# Patient Record
Sex: Male | Born: 1955 | Race: Black or African American | Hispanic: No | Marital: Single | Smoking: Light tobacco smoker
Health system: Southern US, Community
[De-identification: ages and names within clinical notes are randomized; demographics above are authoritative.]

## PROBLEM LIST (undated history)

## (undated) DIAGNOSIS — R519 Headache, unspecified: Secondary | ICD-10-CM

## (undated) DIAGNOSIS — I10 Essential (primary) hypertension: Principal | ICD-10-CM

## (undated) DIAGNOSIS — R51 Headache: Secondary | ICD-10-CM

## (undated) DIAGNOSIS — S82873A Displaced pilon fracture of unspecified tibia, initial encounter for closed fracture: Secondary | ICD-10-CM

## (undated) HISTORY — PX: WISDOM TOOTH EXTRACTION: SHX21

## (undated) HISTORY — DX: Essential (primary) hypertension: I10

## (undated) HISTORY — PX: EYE SURGERY: SHX253

---

## 1999-08-09 ENCOUNTER — Encounter: Payer: Self-pay | Admitting: Emergency Medicine

## 1999-08-09 ENCOUNTER — Emergency Department (HOSPITAL_COMMUNITY): Admission: EM | Admit: 1999-08-09 | Discharge: 1999-08-09 | Payer: Self-pay | Admitting: Emergency Medicine

## 2002-07-13 ENCOUNTER — Ambulatory Visit (HOSPITAL_COMMUNITY): Admission: RE | Admit: 2002-07-13 | Discharge: 2002-07-13 | Payer: Self-pay | Admitting: Family Medicine

## 2005-01-01 ENCOUNTER — Emergency Department (HOSPITAL_COMMUNITY): Admission: EM | Admit: 2005-01-01 | Discharge: 2005-01-01 | Payer: Self-pay | Admitting: Emergency Medicine

## 2015-01-31 ENCOUNTER — Emergency Department (HOSPITAL_COMMUNITY): Payer: Worker's Compensation | Admitting: Certified Registered Nurse Anesthetist

## 2015-01-31 ENCOUNTER — Encounter (HOSPITAL_COMMUNITY): Payer: Self-pay | Admitting: *Deleted

## 2015-01-31 ENCOUNTER — Emergency Department (HOSPITAL_COMMUNITY): Payer: Worker's Compensation

## 2015-01-31 ENCOUNTER — Encounter (HOSPITAL_COMMUNITY)
Admission: EM | Disposition: A | Discharge status: Home / Self Care | Payer: Self-pay | Source: Home / Self Care | Attending: Orthopedic Surgery

## 2015-01-31 ENCOUNTER — Inpatient Hospital Stay (HOSPITAL_COMMUNITY)
Admission: EM | Admit: 2015-01-31 | Discharge: 2015-02-02 | DRG: 492 | Disposition: A | Discharge status: Home / Self Care | Payer: Worker's Compensation | Attending: Orthopedic Surgery | Admitting: Orthopedic Surgery

## 2015-01-31 DIAGNOSIS — Z23 Encounter for immunization: Secondary | ICD-10-CM | POA: Diagnosis not present

## 2015-01-31 DIAGNOSIS — S9702XA Crushing injury of left ankle, initial encounter: Secondary | ICD-10-CM | POA: Diagnosis present

## 2015-01-31 DIAGNOSIS — S82442B Displaced spiral fracture of shaft of left fibula, initial encounter for open fracture type I or II: Secondary | ICD-10-CM | POA: Diagnosis present

## 2015-01-31 DIAGNOSIS — W240XXA Contact with lifting devices, not elsewhere classified, initial encounter: Secondary | ICD-10-CM | POA: Diagnosis present

## 2015-01-31 DIAGNOSIS — S82872B Displaced pilon fracture of left tibia, initial encounter for open fracture type I or II: Secondary | ICD-10-CM | POA: Diagnosis present

## 2015-01-31 DIAGNOSIS — F17210 Nicotine dependence, cigarettes, uncomplicated: Secondary | ICD-10-CM | POA: Diagnosis present

## 2015-01-31 DIAGNOSIS — S82873B Displaced pilon fracture of unspecified tibia, initial encounter for open fracture type I or II: Secondary | ICD-10-CM | POA: Diagnosis present

## 2015-01-31 HISTORY — PX: EXTERNAL FIXATION LEG: SHX1549

## 2015-01-31 SURGERY — EXTERNAL FIXATION, LOWER EXTREMITY
Site: Ankle | Laterality: Left

## 2015-01-31 MED ORDER — SUCCINYLCHOLINE CHLORIDE 20 MG/ML IJ SOLN
INTRAMUSCULAR | Status: DC | PRN
  Administered 2015-01-31: 100 mg via INTRAVENOUS

## 2015-01-31 MED ORDER — HYDROMORPHONE HCL 1 MG/ML IJ SOLN
1.0000 mg | INTRAMUSCULAR | Status: DC | PRN
  Administered 2015-02-01: 1 mg via INTRAVENOUS
  Filled 2015-01-31 (×2): qty 1

## 2015-01-31 MED ORDER — LIDOCAINE HCL (CARDIAC) 20 MG/ML IV SOLN
INTRAVENOUS | Status: AC
  Filled 2015-01-31: qty 15

## 2015-01-31 MED ORDER — CEFAZOLIN SODIUM 1-5 GM-% IV SOLN
1.0000 g | Freq: Four times a day (QID) | INTRAVENOUS | Status: DC
  Administered 2015-01-31 – 2015-02-02 (×6): 1 g via INTRAVENOUS
  Filled 2015-01-31 (×10): qty 50

## 2015-01-31 MED ORDER — DOCUSATE SODIUM 100 MG PO CAPS
100.0000 mg | ORAL_CAPSULE | Freq: Two times a day (BID) | ORAL | Status: DC
Start: 2015-01-31 — End: 2015-02-02
  Administered 2015-01-31 – 2015-02-02 (×4): 100 mg via ORAL
  Filled 2015-01-31 (×4): qty 1

## 2015-01-31 MED ORDER — NEOSTIGMINE METHYLSULFATE 10 MG/10ML IV SOLN
INTRAVENOUS | Status: AC
  Filled 2015-01-31: qty 1

## 2015-01-31 MED ORDER — ONDANSETRON HCL 4 MG PO TABS: 4.0000 mg | ORAL_TABLET | Freq: Four times a day (QID) | ORAL | Status: DC | PRN

## 2015-01-31 MED ORDER — OXYCODONE HCL 5 MG PO TABS
5.0000 mg | ORAL_TABLET | ORAL | Status: DC | PRN
  Administered 2015-01-31 – 2015-02-02 (×6): 10 mg via ORAL
  Filled 2015-01-31 (×6): qty 2

## 2015-01-31 MED ORDER — ONDANSETRON HCL 4 MG/2ML IJ SOLN: 4.0000 mg | Freq: Four times a day (QID) | INTRAMUSCULAR | Status: DC | PRN

## 2015-01-31 MED ORDER — SODIUM CHLORIDE 0.9 % IV SOLN: INTRAVENOUS | Status: DC

## 2015-01-31 MED ORDER — PHENYLEPHRINE HCL 10 MG/ML IJ SOLN
INTRAMUSCULAR | Status: DC | PRN
  Administered 2015-01-31 (×2): 80 ug via INTRAVENOUS

## 2015-01-31 MED ORDER — CEFAZOLIN SODIUM 1-5 GM-% IV SOLN
1.0000 g | Freq: Once | INTRAVENOUS | Status: AC
  Administered 2015-01-31: 1 g via INTRAVENOUS
  Filled 2015-01-31: qty 50

## 2015-01-31 MED ORDER — PHENYLEPHRINE 40 MCG/ML (10ML) SYRINGE FOR IV PUSH (FOR BLOOD PRESSURE SUPPORT)
PREFILLED_SYRINGE | INTRAVENOUS | Status: AC
  Filled 2015-01-31: qty 10

## 2015-01-31 MED ORDER — PROPOFOL 10 MG/ML IV BOLUS
INTRAVENOUS | Status: AC
  Filled 2015-01-31: qty 20

## 2015-01-31 MED ORDER — FENTANYL CITRATE (PF) 250 MCG/5ML IJ SOLN
INTRAMUSCULAR | Status: AC
  Filled 2015-01-31: qty 5

## 2015-01-31 MED ORDER — METOCLOPRAMIDE HCL 5 MG PO TABS: 5.0000 mg | ORAL_TABLET | Freq: Three times a day (TID) | ORAL | Status: DC | PRN

## 2015-01-31 MED ORDER — LIDOCAINE HCL (CARDIAC) 20 MG/ML IV SOLN
INTRAVENOUS | Status: DC | PRN
  Administered 2015-01-31: 100 mg via INTRAVENOUS

## 2015-01-31 MED ORDER — SODIUM CHLORIDE 0.9 % IJ SOLN
INTRAMUSCULAR | Status: AC
  Filled 2015-01-31: qty 10

## 2015-01-31 MED ORDER — MIDAZOLAM HCL 5 MG/5ML IJ SOLN
INTRAMUSCULAR | Status: DC | PRN
  Administered 2015-01-31: 2 mg via INTRAVENOUS

## 2015-01-31 MED ORDER — FENTANYL CITRATE (PF) 100 MCG/2ML IJ SOLN
INTRAMUSCULAR | Status: DC | PRN
  Administered 2015-01-31 (×3): 50 ug via INTRAVENOUS

## 2015-01-31 MED ORDER — FENTANYL CITRATE (PF) 100 MCG/2ML IJ SOLN
50.0000 ug | INTRAMUSCULAR | Status: DC | PRN
  Administered 2015-01-31: 50 ug via INTRAVENOUS
  Filled 2015-01-31: qty 2

## 2015-01-31 MED ORDER — MIDAZOLAM HCL 2 MG/2ML IJ SOLN: 0.5000 mg | Freq: Once | INTRAMUSCULAR | Status: DC | PRN

## 2015-01-31 MED ORDER — MIDAZOLAM HCL 2 MG/2ML IJ SOLN
INTRAMUSCULAR | Status: AC
  Filled 2015-01-31: qty 2

## 2015-01-31 MED ORDER — CEFAZOLIN SODIUM-DEXTROSE 2-3 GM-% IV SOLR
INTRAVENOUS | Status: DC | PRN
  Administered 2015-01-31: 2 g via INTRAVENOUS

## 2015-01-31 MED ORDER — KETOROLAC TROMETHAMINE 30 MG/ML IJ SOLN
30.0000 mg | Freq: Once | INTRAMUSCULAR | Status: AC
  Administered 2015-01-31: 30 mg via INTRAVENOUS

## 2015-01-31 MED ORDER — HYDROMORPHONE HCL 1 MG/ML IJ SOLN
INTRAMUSCULAR | Status: AC
  Administered 2015-01-31: 0.5 mg via INTRAVENOUS
  Filled 2015-01-31: qty 2

## 2015-01-31 MED ORDER — METOCLOPRAMIDE HCL 5 MG/ML IJ SOLN: 5.0000 mg | Freq: Three times a day (TID) | INTRAMUSCULAR | Status: DC | PRN

## 2015-01-31 MED ORDER — ACETAMINOPHEN 650 MG RE SUPP: 650.0000 mg | Freq: Four times a day (QID) | RECTAL | Status: DC | PRN

## 2015-01-31 MED ORDER — TETANUS-DIPHTH-ACELL PERTUSSIS 5-2.5-18.5 LF-MCG/0.5 IM SUSP
0.5000 mL | Freq: Once | INTRAMUSCULAR | Status: AC
  Administered 2015-01-31: 0.5 mL via INTRAMUSCULAR
  Filled 2015-01-31: qty 0.5

## 2015-01-31 MED ORDER — ONDANSETRON HCL 4 MG/2ML IJ SOLN
INTRAMUSCULAR | Status: AC
  Filled 2015-01-31: qty 2

## 2015-01-31 MED ORDER — GENTAMICIN SULFATE 40 MG/ML IJ SOLN
1.5000 mg/kg | INTRAVENOUS | Status: AC
  Administered 2015-01-31: 130 mg via INTRAVENOUS
  Filled 2015-01-31: qty 3.25

## 2015-01-31 MED ORDER — DEXTROSE 5 % IV SOLN
500.0000 mg | Freq: Four times a day (QID) | INTRAVENOUS | Status: DC | PRN
  Administered 2015-01-31: 500 mg via INTRAVENOUS
  Filled 2015-01-31 (×2): qty 5

## 2015-01-31 MED ORDER — ACETAMINOPHEN 325 MG PO TABS
650.0000 mg | ORAL_TABLET | Freq: Four times a day (QID) | ORAL | Status: DC | PRN
  Administered 2015-02-01 – 2015-02-02 (×3): 650 mg via ORAL
  Filled 2015-01-31 (×3): qty 2

## 2015-01-31 MED ORDER — 0.9 % SODIUM CHLORIDE (POUR BTL) OPTIME
TOPICAL | Status: DC | PRN
  Administered 2015-01-31: 1000 mL

## 2015-01-31 MED ORDER — ASPIRIN EC 325 MG PO TBEC
325.0000 mg | DELAYED_RELEASE_TABLET | Freq: Every day | ORAL | Status: DC
  Administered 2015-01-31 – 2015-02-02 (×3): 325 mg via ORAL
  Filled 2015-01-31 (×3): qty 1

## 2015-01-31 MED ORDER — ONDANSETRON HCL 4 MG/2ML IJ SOLN
INTRAMUSCULAR | Status: DC | PRN
Start: 2015-01-31 — End: 2015-01-31
  Administered 2015-01-31: 4 mg via INTRAVENOUS

## 2015-01-31 MED ORDER — MEPERIDINE HCL 25 MG/ML IJ SOLN
6.2500 mg | INTRAMUSCULAR | Status: DC | PRN
Start: 2015-01-31 — End: 2015-01-31

## 2015-01-31 MED ORDER — HYDROMORPHONE HCL 1 MG/ML IJ SOLN
0.2500 mg | INTRAMUSCULAR | Status: DC | PRN
  Administered 2015-01-31 (×4): 0.5 mg via INTRAVENOUS

## 2015-01-31 MED ORDER — PROMETHAZINE HCL 25 MG/ML IJ SOLN: 6.2500 mg | INTRAMUSCULAR | Status: DC | PRN

## 2015-01-31 MED ORDER — METHOCARBAMOL 500 MG PO TABS
500.0000 mg | ORAL_TABLET | Freq: Four times a day (QID) | ORAL | Status: DC | PRN
  Administered 2015-02-01 – 2015-02-02 (×5): 500 mg via ORAL
  Filled 2015-01-31 (×6): qty 1

## 2015-01-31 MED ORDER — ROCURONIUM BROMIDE 50 MG/5ML IV SOLN
INTRAVENOUS | Status: AC
  Filled 2015-01-31: qty 1

## 2015-01-31 MED ORDER — PROPOFOL 10 MG/ML IV BOLUS
INTRAVENOUS | Status: DC | PRN
  Administered 2015-01-31: 160 mg via INTRAVENOUS

## 2015-01-31 MED ORDER — EPHEDRINE SULFATE 50 MG/ML IJ SOLN
INTRAMUSCULAR | Status: AC
  Filled 2015-01-31: qty 1

## 2015-01-31 MED ORDER — KETOROLAC TROMETHAMINE 30 MG/ML IJ SOLN
INTRAMUSCULAR | Status: AC
  Filled 2015-01-31: qty 1

## 2015-01-31 MED ORDER — SODIUM CHLORIDE 0.9 % IV SOLN
INTRAVENOUS | Status: DC | PRN
  Administered 2015-01-31: 17:00:00 via INTRAVENOUS

## 2015-01-31 SURGICAL SUPPLY — 50 items
BANDAGE ESMARK 6X9 LF (GAUZE/BANDAGES/DRESSINGS) IMPLANT
BNDG CMPR 9X6 STRL LF SNTH (GAUZE/BANDAGES/DRESSINGS)
BNDG COHESIVE 4X5 TAN STRL (GAUZE/BANDAGES/DRESSINGS) ×4 IMPLANT
BNDG ESMARK 6X9 LF (GAUZE/BANDAGES/DRESSINGS)
BNDG GAUZE ELAST 4 BULKY (GAUZE/BANDAGES/DRESSINGS) ×4 IMPLANT
CLAMP LG COMBINATION (Clamp) ×6 IMPLANT
CLAMP LG MULTI PIN (Clamp) ×3 IMPLANT
CLAMP ROD ATTACHMENT (Clamp) ×3 IMPLANT
COVER SURGICAL LIGHT HANDLE (MISCELLANEOUS) ×5 IMPLANT
CUFF TOURNIQUET SINGLE 34IN LL (TOURNIQUET CUFF) IMPLANT
CUFF TOURNIQUET SINGLE 44IN (TOURNIQUET CUFF) IMPLANT
DRAPE INCISE IOBAN 66X45 STRL (DRAPES) ×1 IMPLANT
DRAPE OEC MINIVIEW 54X84 (DRAPES) ×3 IMPLANT
DRAPE PROXIMA HALF (DRAPES) ×4 IMPLANT
DRAPE U-SHAPE 47X51 STRL (DRAPES) ×4 IMPLANT
DRSG ADAPTIC 3X8 NADH LF (GAUZE/BANDAGES/DRESSINGS) ×4 IMPLANT
DRSG PAD ABDOMINAL 8X10 ST (GAUZE/BANDAGES/DRESSINGS) ×19 IMPLANT
DURAPREP 26ML APPLICATOR (WOUND CARE) ×4 IMPLANT
ELECT REM PT RETURN 9FT ADLT (ELECTROSURGICAL) ×4
ELECTRODE REM PT RTRN 9FT ADLT (ELECTROSURGICAL) ×2 IMPLANT
FLUID NSS /IRRIG 3000 ML XXX (IV SOLUTION) ×3 IMPLANT
GAUZE SPONGE 4X4 12PLY STRL (GAUZE/BANDAGES/DRESSINGS) ×4 IMPLANT
GLOVE BIOGEL PI IND STRL 9 (GLOVE) ×2 IMPLANT
GLOVE BIOGEL PI INDICATOR 9 (GLOVE) ×2
GLOVE SURG ORTHO 9.0 STRL STRW (GLOVE) ×4 IMPLANT
GOWN STRL REUS W/ TWL XL LVL3 (GOWN DISPOSABLE) ×6 IMPLANT
GOWN STRL REUS W/TWL XL LVL3 (GOWN DISPOSABLE) ×12
HANDPIECE INTERPULSE COAX TIP (DISPOSABLE) ×4
KIT BASIN OR (CUSTOM PROCEDURE TRAY) ×4 IMPLANT
KIT ROOM TURNOVER OR (KITS) ×4 IMPLANT
MANIFOLD NEPTUNE II (INSTRUMENTS) ×4 IMPLANT
NS IRRIG 1000ML POUR BTL (IV SOLUTION) ×4 IMPLANT
PACK ORTHO EXTREMITY (CUSTOM PROCEDURE TRAY) ×4 IMPLANT
PAD ABD 8X10 STRL (GAUZE/BANDAGES/DRESSINGS) ×3 IMPLANT
PAD ARMBOARD 7.5X6 YLW CONV (MISCELLANEOUS) ×8 IMPLANT
PIN SHANZ TRANSFIX 6.0X225 (PIN) ×3 IMPLANT
ROD CRBN FBR LRG EX-FX 11X250 (Rod) ×6 IMPLANT
SCREW SCHNZ SD 5.0 60 THRD/150 (Screw) ×2 IMPLANT
SCRW SCHANZ SD 5.0 60 THRD/150 (Screw) ×8 IMPLANT
SET HNDPC FAN SPRY TIP SCT (DISPOSABLE) ×1 IMPLANT
SPONGE LAP 18X18 X RAY DECT (DISPOSABLE) ×4 IMPLANT
STAPLER VISISTAT 35W (STAPLE) IMPLANT
SUCTION FRAZIER TIP 10 FR DISP (SUCTIONS) ×4 IMPLANT
SUT ETHILON 2 0 PSLX (SUTURE) IMPLANT
SUT VIC AB 2-0 CTB1 (SUTURE) ×8 IMPLANT
TOWEL OR 17X24 6PK STRL BLUE (TOWEL DISPOSABLE) ×4 IMPLANT
TOWEL OR 17X26 10 PK STRL BLUE (TOWEL DISPOSABLE) ×4 IMPLANT
TUBE CONNECTING 12'X1/4 (SUCTIONS) ×1
TUBE CONNECTING 12X1/4 (SUCTIONS) ×3 IMPLANT
transvix shanz pin ×3 IMPLANT

## 2015-01-31 NOTE — Transfer of Care (Signed)
Immediate Anesthesia Transfer of Care Note  Patient: Larry Hancock  Procedure(s) Performed: Procedure(s): OPEN REDUCTION INTERNAL FIXATION (ORIF) ANKLE FRACTURE (Left)  Patient Location: PACU  Anesthesia Type:General  Level of Consciousness: awake  Airway & Oxygen Therapy: Patient Spontanous Breathing and Patient connected to nasal cannula oxygen  Post-op Assessment: Report given to RN and Post -op Vital signs reviewed and stable  Post vital signs: Reviewed and stable  Last Vitals:  Filed Vitals:   01/31/15 1900  BP: 152/95  Pulse: 82  Temp: 36.7 C  Resp: 19    Complications: No apparent anesthesia complications

## 2015-01-31 NOTE — Anesthesia Preprocedure Evaluation (Signed)
Anesthesia Evaluation  Patient identified by MRN, date of birth, ID band Patient awake    Reviewed: Allergy & Precautions, NPO status , Patient's Chart, lab work & pertinent test results  History of Anesthesia Complications Negative for: history of anesthetic complications  Airway Mallampati: I  TM Distance: >3 FB Neck ROM: Full    Dental  (+) Teeth Intact, Dental Advisory Given   Pulmonary COPDCurrent Smoker,  breath sounds clear to auscultation        Cardiovascular - anginanegative cardio ROS  Rhythm:Regular Rate:Normal     Neuro/Psych negative neurological ROS     GI/Hepatic Neg liver ROS, GERD-  Controlled,  Endo/Other  negative endocrine ROS  Renal/GU negative Renal ROS     Musculoskeletal   Abdominal (+) + obese,   Peds  Hematology negative hematology ROS (+)   Anesthesia Other Findings   Reproductive/Obstetrics                             Anesthesia Physical Anesthesia Plan  ASA: II  Anesthesia Plan: General   Post-op Pain Management:    Induction: Intravenous  Airway Management Planned: Oral ETT  Additional Equipment:   Intra-op Plan:   Post-operative Plan: Extubation in OR  Informed Consent: I have reviewed the patients History and Physical, chart, labs and discussed the procedure including the risks, benefits and alternatives for the proposed anesthesia with the patient or authorized representative who has indicated his/her understanding and acceptance.   Dental advisory given  Plan Discussed with: CRNA and Surgeon  Anesthesia Plan Comments: (Plan routine monitors, GETA)        Anesthesia Quick Evaluation

## 2015-01-31 NOTE — ED Notes (Signed)
Pt arrives from Urgent Care via EMS. Pt was at work standing on a pallet when a pallet jack caught his leg between the pallet. Pt arrives with obvious deformity to left ankle with abrasions that are actively bleeding. Dr Radford PaxBeaton in room. Good pulses.

## 2015-01-31 NOTE — Anesthesia Postprocedure Evaluation (Signed)
  Anesthesia Post-op Note  Patient: Larry Hancock  Procedure(s) Performed: Procedure(s): EXTERNAL FIXATION LEG (Left)  Patient Location: PACU  Anesthesia Type:General  Level of Consciousness: awake, alert  and sedated  Airway and Oxygen Therapy: Patient Spontanous Breathing  Post-op Pain: mild  Post-op Assessment: Post-op Vital signs reviewed  Post-op Vital Signs: stable  Last Vitals:  Filed Vitals:   01/31/15 1915  BP: 170/99  Pulse: 84  Temp:   Resp: 17    Complications: No apparent anesthesia complications

## 2015-01-31 NOTE — Anesthesia Procedure Notes (Signed)
Procedure Name: Intubation Date/Time: 01/31/2015 6:15 PM Performed by: Merdis Delay Pre-anesthesia Checklist: Patient identified, Timeout performed, Emergency Drugs available, Suction available and Patient being monitored Patient Re-evaluated:Patient Re-evaluated prior to inductionOxygen Delivery Method: Circle system utilized Preoxygenation: Pre-oxygenation with 100% oxygen Intubation Type: IV induction Laryngoscope Size: Mac and 4 Grade View: Grade II Tube type: Oral Tube size: 7.5 mm Number of attempts: 1 Airway Equipment and Method: Stylet and LTA kit utilized Placement Confirmation: ETT inserted through vocal cords under direct vision,  positive ETCO2 and CO2 detector Secured at: 22 cm Tube secured with: Tape Dental Injury: Teeth and Oropharynx as per pre-operative assessment

## 2015-01-31 NOTE — Op Note (Signed)
01/31/2015  6:54 PM  PATIENT:  Larry Hancock    PRE-OPERATIVE DIAGNOSIS:  Open Sinclair ShipAnderson Gustilo grade 1 left pilon fracture including both fibula and tibia  POST-OPERATIVE DIAGNOSIS:  Same  PROCEDURE:  External fixation left pilon fracture. Irrigation and debridement of open fracture with extension of the grade 1 open wound to a 3 cm incision. C-arm fluoroscopy for alignment of the external fixator.  SURGEON:  Nadara MustardUDA,MARCUS V, MD  PHYSICIAN ASSISTANT:None ANESTHESIA:   General  PREOPERATIVE INDICATIONS:  Larry SjogrenSteve Wynns is a  59 y.o. male with a diagnosis of Left Ankle Fx who failed conservative measures and elected for surgical management.    The risks benefits and alternatives were discussed with the patient preoperatively including but not limited to the risks of infection, bleeding, nerve injury, cardiopulmonary complications, the need for revision surgery, among others, and the patient was willing to proceed.  OPERATIVE IMPLANTS: External fixation Synthes.  OPERATIVE FINDINGS: Wound was clean with no foreign material. Patient had ischemic changes already to the medial and lateral malleolus from the crush injury.  OPERATIVE PROCEDURE: Patient was brought to the operating room and underwent a general anesthetic. Patient received 3 g of preoperatively received tetanus and received 130 mg of gentamicin. After adequate levels of anesthesia were obtained patient's left lower extremity was first cleansed using Hibiclens dried then prepped using DuraPrep draped into a sterile field. A timeout was called. An extensile incision was made over the type I open wound over the fibula. This was extended to 3 cm and this was irrigated with 3 L of pulsatile lavage. There was no foreign material within the wound. The wound was loosely closed with 2-0 nylon. An external fixator was applied with a delta frame construct with a transfixing pin through the calcaneus 2 pins to the tibia and a delta frame construct was  obtained. C-arm fluoroscopy verified alignment in both AP and lateral planes. Of note patient has an intra-articular fragment of the pilon fracture. A sterile dressing was applied. Patient was extubated taken to the PACU in stable condition. Plan for open reduction internal fixation once the soft tissue envelope has healed.

## 2015-01-31 NOTE — H&P (Signed)
Levy SjogrenSteve Angelo is an 59 y.o. male.   Chief Complaint: Open pilon and fibular fracture left ankle HPI: Patient is a 59 year old gentleman who states that he was at work he got off a Neurosurgeonpallet jack and the palate broke and the pallet jack ran into his ankle and he had his left ankle crest between the pallet jack and a pallet.  History reviewed. No pertinent past medical history.  History reviewed. No pertinent past surgical history.  No family history on file. Social History:  reports that he has been smoking Cigarettes.  He has been smoking about 0.50 packs per day. He does not have any smokeless tobacco history on file. He reports that he does not drink alcohol or use illicit drugs.  Allergies: No Known Allergies  Medications Prior to Admission  Medication Sig Dispense Refill  . ibuprofen (ADVIL,MOTRIN) 200 MG tablet Take 200 mg by mouth every 6 (six) hours as needed for headache.      No results found for this or any previous visit (from the past 48 hour(s)). Dg Ankle Complete Left  01/31/2015   CLINICAL DATA:  Pain following fall. Machine crossed over ankle after fall  EXAM: LEFT ANKLE COMPLETE - 3+ VIEW  COMPARISON:  None.  FINDINGS: Frontal, oblique, and lateral views obtained. There is a comminuted fracture of the distal tibial metaphysis -diaphysis junction with posterior displacement of the major distal fracture fragment compared to the major proximal fragment. There is a comminuted spiral fracture of the distal fibular diaphysis, with medial displacement of the distal major fracture fragment with respect major proximal fragment. The ankle mortise appears intact. There is extensive soft tissue air in the lower extremity region. There is no appreciable joint space narrowing.  IMPRESSION: Comminuted fractures of the distal tibia and fibula. Extensive soft tissue air in the left lower extremity. Ankle mortise appears intact.   Electronically Signed   By: Bretta BangWilliam  Woodruff III M.D.   On: 01/31/2015  16:16    Review of Systems  All other systems reviewed and are negative.   Blood pressure 164/89, pulse 77, temperature 98.4 F (36.9 C), temperature source Oral, height 5\' 9"  (1.753 m), weight 88.451 kg (195 lb), SpO2 97 %. Physical Exam   On examination patient is a strong dorsalis pedis pulse. He has abrasions laterally of the left ankle and ischemic ulcer over the medial malleolus. Radiographs shows a pilon fracture Of the the tibia and a fibula fracture as well. Assessment/Plan Assessment: Open left pilon fracture and left fibular fracture.  Plan: Due to the significant crush injury to the soft tissue medially and laterally to the ankle. I do not feel that it is safe at this time to proceed with open reduction internal fixation. We will plan for external fixation irrigation and debridement of the open wounds starting on IV antibiotics. Once the soft tissue quiets down we will proceed with open reduction internal fixation. Patient is currently a smoker and discussed that he absolutely has to quit smoking. Discussed the risk of possible loss of limb with tobacco use. Patient states he understands wishes to proceed at this time.  Orel Cooler V 01/31/2015, 5:45 PM

## 2015-02-01 ENCOUNTER — Encounter (HOSPITAL_COMMUNITY): Payer: Self-pay | Admitting: Orthopedic Surgery

## 2015-02-01 NOTE — Progress Notes (Signed)
Patient ID: Larry SjogrenSteve Conran, male   DOB: [birth year], 59 y.o.   MRN: [id number] Postoperative day 1 status post external fixation for open type I pilon fracture left ankle. Plan for physical therapy progressive ambulation nonweightbearing on the left. Patient will eventually need open reduction internal fixation due to the non-congruence of the tibial talar joint.

## 2015-02-01 NOTE — ED Notes (Signed)
Patient wallet and cash locked in safe with security and other belongings with pt to OR

## 2015-02-01 NOTE — Progress Notes (Signed)
MD on call made aware of patient's high bp which has already been high prior to surgery. BP 170's/90's, heart rate wnl 70-80's, no complaints of chest pains or sob. Patient resting comfortably will continue to monitor.

## 2015-02-01 NOTE — Evaluation (Signed)
Physical Therapy Evaluation Patient Details Name: Larry SjogrenSteve Hancock MRN: [id number] DOB: 1956/07/30 Today's Date: 02/01/2015   History of Present Illness  Pt is a 59 y/o M s/p L tib fib grade I pilon fx now w/ ex fix.  Pt scheduled for ORIF L ankle on 02/03/15.  Pt has no pertinent PMH besides being a current smoker.  Clinical Impression  Patient is s/p above surgery resulting in functional limitations due to the deficits listed below (see PT Problem List). Pt demonstrated ability to ambulate 20 ft w/ RW this session.  Patient will benefit from skilled PT to increase their independence and safety with mobility to allow discharge to the venue listed below.      Follow Up Recommendations Outpatient PT;Supervision - Intermittent    Equipment Recommendations  Rolling walker with 5" wheels;3in1 (PT)    Recommendations for Other Services       Precautions / Restrictions Precautions Precautions: Fall Restrictions Weight Bearing Restrictions: Yes LLE Weight Bearing: Non weight bearing      Mobility  Bed Mobility Overal bed mobility: Modified Independent             General bed mobility comments: Min use of bed rails, increased time, min verbal cues for sequencing  Transfers Overall transfer level: Needs assistance Equipment used: Rolling walker (2 wheeled) Transfers: Sit to/from Stand Sit to Stand: Min guard         General transfer comment: Cues for proper hand placement and to push from bed.  Cues to stand up straigt once standing EOB.   Ambulation/Gait Ambulation/Gait assistance: Min guard Ambulation Distance (Feet): 20 Feet Assistive device: Rolling walker (2 wheeled) Gait Pattern/deviations: Antalgic;Decreased weight shift to left;Trunk flexed   Gait velocity interpretation: Below normal speed for age/gender General Gait Details: hop on RLE, trunk flexion, cues for hopping and to stand upright.  Stairs            Wheelchair Mobility    Modified Rankin (Stroke  Patients Only)       Balance Overall balance assessment: Needs assistance Sitting-balance support: No upper extremity supported;Feet supported Sitting balance-Leahy Scale: Fair     Standing balance support: Bilateral upper extremity supported;During functional activity Standing balance-Leahy Scale: Fair                               Pertinent Vitals/Pain Pain Assessment: 0-10 Pain Score: 7  Pain Location: L ankle Pain Descriptors / Indicators: Aching    Home Living Family/patient expects to be discharged to:: Private residence Living Arrangements: Alone Available Help at Discharge: Family;Available PRN/intermittently (Sister lives 15 minutes away and is retired) Type of Home: House Home Access: Stairs to enter Entrance Stairs-Rails: None Secretary/administratorntrance Stairs-Number of Steps: 1 Home Layout: One level Home Equipment: None      Prior Function Level of Independence: Independent               Hand Dominance   Dominant Hand: Right    Extremity/Trunk Assessment               Lower Extremity Assessment: LLE deficits/detail   LLE Deficits / Details: weakness and limited ROM as expected s/p L ankle ex fix     Communication   Communication: No difficulties  Cognition Arousal/Alertness: Awake/alert Behavior During Therapy: WFL for tasks assessed/performed Overall Cognitive Status: Within Functional Limits for tasks assessed  General Comments General comments (skin integrity, edema, etc.): Provided pt w/ information on knee walker.  Pt agreed to review it today to decide if it will be financially possible.    Exercises Total Joint Exercises Ankle Circles/Pumps: AROM;Right;10 reps;Supine Straight Leg Raises: AROM;Both;10 reps;Supine Long Arc Quad: AROM;Left;10 reps;Seated      Assessment/Plan    PT Assessment Patient needs continued PT services  PT Diagnosis Difficulty walking;Abnormality of gait;Generalized  weakness;Acute pain   PT Problem List Decreased strength;Decreased range of motion;Decreased activity tolerance;Decreased balance;Decreased mobility;Decreased coordination;Decreased knowledge of use of DME;Decreased safety awareness;Decreased knowledge of precautions;Decreased skin integrity;Pain  PT Treatment Interventions DME instruction;Gait training;Stair training;Functional mobility training;Therapeutic activities;Therapeutic exercise;Balance training;Neuromuscular re-education;Patient/family education;Modalities   PT Goals (Current goals can be found in the Care Plan section) Acute Rehab PT Goals Patient Stated Goal: to get stronger PT Goal Formulation: With patient Time For Goal Achievement: 02/15/15 Potential to Achieve Goals: Good    Frequency Min 5X/week   Barriers to discharge Inaccessible home environment;Decreased caregiver support 1 step to enter home w/o rails; intermittent assist at home    Co-evaluation               End of Session Equipment Utilized During Treatment: Gait belt Activity Tolerance: Patient tolerated treatment well Patient left: in chair;with call bell/phone within reach Nurse Communication: Mobility status;Precautions;Weight bearing status         Time: [id number] PT Time Calculation (min) (ACUTE ONLY): 26 min   Charges:   PT Evaluation $Initial PT Evaluation Tier I: 1 Procedure PT Treatments $Gait Training: 8-22 mins   PT G CodesMichail Jewels:       [name] PT, DPT [phone] Pager: [phone] 02/01/2015, 12:23 PM

## 2015-02-02 ENCOUNTER — Encounter (HOSPITAL_COMMUNITY): Payer: Self-pay | Admitting: General Practice

## 2015-02-02 MED ORDER — PNEUMOCOCCAL VAC POLYVALENT 25 MCG/0.5ML IJ INJ: 0.5000 mL | INJECTION | INTRAMUSCULAR | Status: DC

## 2015-02-02 MED ORDER — OXYCODONE-ACETAMINOPHEN 5-325 MG PO TABS: 1.0000 | ORAL_TABLET | ORAL | Status: DC | PRN

## 2015-02-02 NOTE — Progress Notes (Signed)
Physical Therapy Treatment Patient Details Name: Larry Hancock MRN: [id number] DOB: 1956-05-16 Today's Date: 02/02/2015    History of Present Illness Pt is a 59 y/o M s/p L tib fib grade I pilon fx now w/ ex fix.  Pt scheduled for ORIF L ankle on 02/06/15.  Pt has no pertinent PMH besides being a current smoker.    PT Comments    Patient is making good progress with PT. Pt is anticipating d/c today and to return on 02/06/15 for ORIF L ankle. Pt demonstrated ability to ambulate 50 ft and ascend/descend 1 step this session.  Pt encouraged to review information on knee walker and pt agreed to determine if he will be renting/buying knee walker by Monday.   Follow Up Recommendations  Outpatient PT;Supervision - Intermittent     Equipment Recommendations  Rolling walker with 5" wheels;3in1 (PT)    Recommendations for Other Services       Precautions / Restrictions Precautions Precautions: Fall Restrictions Weight Bearing Restrictions: Yes LLE Weight Bearing: Non weight bearing    Mobility  Bed Mobility Overal bed mobility: Modified Independent             General bed mobility comments: Increased time, good carryover from last session  Transfers Overall transfer level: Needs assistance Equipment used: Rolling walker (2 wheeled) Transfers: Sit to/from Stand Sit to Stand: Supervision         General transfer comment: Good carryover from last session.  Reminder to push from stable surface and to ensure RW is directly in front of pt prior to sit>stand  Ambulation/Gait Ambulation/Gait assistance: Supervision Ambulation Distance (Feet): 50 Feet Assistive device: Rolling walker (2 wheeled) Gait Pattern/deviations: Antalgic;Trunk flexed   Gait velocity interpretation: Below normal speed for age/gender General Gait Details: hop on RLE   Stairs Stairs: Yes Stairs assistance: Min guard Stair Management: No rails;Backwards;With walker Number of Stairs: 1 General stair  comments: demonstration and verbal cues for proper sequencing  Wheelchair Mobility    Modified Rankin (Stroke Patients Only)       Balance Overall balance assessment: Needs assistance Sitting-balance support: Single extremity supported;Feet supported Sitting balance-Leahy Scale: Fair     Standing balance support: Bilateral upper extremity supported;During functional activity Standing balance-Leahy Scale: Fair                      Cognition Arousal/Alertness: Awake/alert Behavior During Therapy: WFL for tasks assessed/performed Overall Cognitive Status: Within Functional Limits for tasks assessed                      Exercises Total Joint Exercises Ankle Circles/Pumps: AROM;Right;10 reps;Supine Long Arc Quad: AROM;Both;10 reps;Seated    General Comments        Pertinent Vitals/Pain Pain Assessment: No/denies pain    Home Living                      Prior Function            PT Goals (current goals can now be found in the care plan section) Acute Rehab PT Goals Patient Stated Goal: to get stronger Progress towards PT goals: Progressing toward goals    Frequency  Min 5X/week    PT Plan Current plan remains appropriate    Co-evaluation             End of Session Equipment Utilized During Treatment: Gait belt Activity Tolerance: Patient tolerated treatment well Patient left: in chair;with call bell/phone within  reach     Time: 1636-1650 PT Time Calculation (min) (ACUTE ONLY): 14 min  Charges:  $Gait Training: 8-22 mins                    G Codes:      Michail JewelsAshley Parr PT, TennesseeDPT [phone] Pager: [phone] 02/02/2015, 5:08 PM

## 2015-02-02 NOTE — Discharge Summary (Signed)
Physician Discharge Summary  Patient ID: Larry Hancock MRN: [id number] DOB/AGE: 60-07-1956 60 y.o.  Admit date: 01/31/2015 Discharge date: 02/02/2015  Admission Diagnoses: Open pilon fracture left ankle involving both tibia and fibula  Discharge Diagnoses:  Active Problems:   Open pilon fracture   Discharged Condition: stable  Hospital Course: Patient's hospital course essentially unremarkable. Patient underwent external fixation due to the crush injury to the skin which was not amenable to open reduction internal fixation. Patient underwent irrigation and debridement of the open grade 1 wound and was placed on antibiotics. Patient progressed well was discharged to home in stable condition with plan to return for open reduction internal fixation next week.  Consults: None  Significant Diagnostic Studies: labs: Routine labs  Treatments: surgery: See operative note  Discharge Exam: Blood pressure 181/92, pulse 81, temperature 98.7 F (37.1 C), temperature source Oral, resp. rate 18, height 5\' 9"  (1.753 m), weight 88.451 kg (195 lb), SpO2 98 %. Incision/Wound: dressing clean and dry  Disposition: Final discharge disposition not confirmed     Medication List    ASK your doctor about these medications        ibuprofen 200 MG tablet  Commonly known as:  ADVIL,MOTRIN  Take 200 mg by mouth every 6 (six) hours as needed for headache.           Follow-up Information    Follow up with DUDA,MARCUS V, MD In 1 week.   Specialty:  Orthopedic Surgery   Why:  Call for appointment with follow-up this Monday   Contact information:   [address] [place] [address] [phone]       Signed: Nadara MustardDUDA,MARCUS V 02/02/2015, 6:35 AM

## 2015-02-03 ENCOUNTER — Other Ambulatory Visit (HOSPITAL_COMMUNITY): Payer: Self-pay | Admitting: Orthopedic Surgery

## 2015-02-07 ENCOUNTER — Encounter (HOSPITAL_COMMUNITY): Payer: Self-pay | Admitting: *Deleted

## 2015-02-07 MED ORDER — CEFAZOLIN SODIUM-DEXTROSE 2-3 GM-% IV SOLR
2.0000 g | INTRAVENOUS | Status: AC
  Administered 2015-02-08: 2 g via INTRAVENOUS
  Filled 2015-02-07: qty 50

## 2015-02-07 NOTE — Progress Notes (Signed)
Pt denies SOB, chest pain, and being under the care of a cardiologist. Pt denies having an EKG and chest x ray within the last year. Pt denies having a stress test, echo and cardiac cath. Pt made aware to stop  taking Aspirin, otc vitamins and herbal medications. Do not take any NSAIDs ie: Ibuprofen, Advil, Naproxen or any medication containing Aspirin. Pt verbalized understanding of all pre-op instructions. 

## 2015-02-08 ENCOUNTER — Inpatient Hospital Stay (HOSPITAL_COMMUNITY)
Admission: RE | Admit: 2015-02-08 | Discharge: 2015-02-13 | DRG: 493 | Disposition: A | Discharge status: Home / Self Care | Payer: Worker's Compensation | Source: Ambulatory Visit | Attending: Orthopedic Surgery | Admitting: Orthopedic Surgery

## 2015-02-08 ENCOUNTER — Encounter (HOSPITAL_COMMUNITY): Payer: Self-pay | Admitting: *Deleted

## 2015-02-08 ENCOUNTER — Encounter (HOSPITAL_COMMUNITY)
Admission: RE | Disposition: A | Discharge status: Home / Self Care | Payer: Self-pay | Source: Ambulatory Visit | Attending: Orthopedic Surgery

## 2015-02-08 ENCOUNTER — Inpatient Hospital Stay (HOSPITAL_COMMUNITY): Payer: Worker's Compensation | Admitting: Anesthesiology

## 2015-02-08 DIAGNOSIS — S82872B Displaced pilon fracture of left tibia, initial encounter for open fracture type I or II: Secondary | ICD-10-CM | POA: Diagnosis present

## 2015-02-08 DIAGNOSIS — S82872A Displaced pilon fracture of left tibia, initial encounter for closed fracture: Secondary | ICD-10-CM | POA: Diagnosis present

## 2015-02-08 DIAGNOSIS — F17210 Nicotine dependence, cigarettes, uncomplicated: Secondary | ICD-10-CM | POA: Diagnosis present

## 2015-02-08 DIAGNOSIS — I82432 Acute embolism and thrombosis of left popliteal vein: Secondary | ICD-10-CM | POA: Diagnosis present

## 2015-02-08 DIAGNOSIS — I82412 Acute embolism and thrombosis of left femoral vein: Secondary | ICD-10-CM | POA: Diagnosis present

## 2015-02-08 DIAGNOSIS — R609 Edema, unspecified: Secondary | ICD-10-CM

## 2015-02-08 DIAGNOSIS — I2699 Other pulmonary embolism without acute cor pulmonale: Secondary | ICD-10-CM

## 2015-02-08 HISTORY — DX: Headache, unspecified: R51.9

## 2015-02-08 HISTORY — DX: Displaced pilon fracture of unspecified tibia, initial encounter for closed fracture: S82.873A

## 2015-02-08 HISTORY — DX: Headache: R51

## 2015-02-08 HISTORY — PX: OPEN REDUCTION INTERNAL FIXATION (ORIF) TIBIA/FIBULA FRACTURE: SHX5992

## 2015-02-08 HISTORY — PX: EXTERNAL FIXATION REMOVAL: SHX5040

## 2015-02-08 LAB — COMPREHENSIVE METABOLIC PANEL
ALT: 39 U/L (ref 17–63)
AST: 41 U/L (ref 15–41)
Albumin: 3.3 g/dL — ABNORMAL LOW (ref 3.5–5.0)
Alkaline Phosphatase: 61 U/L (ref 38–126)
Anion gap: 11 (ref 5–15)
BUN: 18 mg/dL (ref 6–20)
CALCIUM: 8.9 mg/dL (ref 8.9–10.3)
CO2: 24 mmol/L (ref 22–32)
CREATININE: 1.01 mg/dL (ref 0.61–1.24)
Chloride: 103 mmol/L (ref 101–111)
GFR calc Af Amer: >60 mL/min (ref >60)
GFR calc non Af Amer: >60 mL/min (ref >60)
GLUCOSE: 99 mg/dL (ref 70–99)
Potassium: 4.4 mmol/L (ref 3.5–5.1)
Sodium: 138 mmol/L (ref 135–145)
Total Bilirubin: 0.7 mg/dL (ref 0.3–1.2)
Total Protein: 7.2 g/dL (ref 6.5–8.1)

## 2015-02-08 LAB — CBC
HCT: 37.8 % — ABNORMAL LOW (ref 39.0–52.0)
HEMOGLOBIN: 12.7 g/dL — AB (ref 13.0–17.0)
MCH: 29.5 pg (ref 26.0–34.0)
MCHC: 33.6 g/dL (ref 30.0–36.0)
MCV: 87.9 fL (ref 78.0–100.0)
Platelets: 241 10*3/uL (ref 150–400)
RBC: 4.3 MIL/uL (ref 4.22–5.81)
RDW: 14.1 % (ref 11.5–15.5)
WBC: 9.9 10*3/uL (ref 4.0–10.5)

## 2015-02-08 LAB — APTT: APTT: 28 s (ref 24–37)

## 2015-02-08 LAB — PROTIME-INR
INR: 1.04 (ref 0.00–1.49)
Prothrombin Time: 13.7 seconds (ref 11.6–15.2)

## 2015-02-08 SURGERY — REMOVAL, EXTERNAL FIXATION DEVICE, LOWER EXTREMITY
Anesthesia: General | Laterality: Left

## 2015-02-08 MED ORDER — LACTATED RINGERS IV SOLN
INTRAVENOUS | Status: DC | PRN
  Administered 2015-02-08 (×2): via INTRAVENOUS

## 2015-02-08 MED ORDER — ONDANSETRON HCL 4 MG/2ML IJ SOLN: 4.0000 mg | Freq: Four times a day (QID) | INTRAMUSCULAR | Status: DC | PRN

## 2015-02-08 MED ORDER — ASPIRIN EC 325 MG PO TBEC
325.0000 mg | DELAYED_RELEASE_TABLET | Freq: Every day | ORAL | Status: DC
Start: 2015-02-08 — End: 2015-02-13
  Administered 2015-02-08 – 2015-02-13 (×6): 325 mg via ORAL
  Filled 2015-02-08 (×6): qty 1

## 2015-02-08 MED ORDER — ONDANSETRON HCL 4 MG PO TABS: 4.0000 mg | ORAL_TABLET | Freq: Four times a day (QID) | ORAL | Status: DC | PRN

## 2015-02-08 MED ORDER — LABETALOL HCL 5 MG/ML IV SOLN
INTRAVENOUS | Status: AC
  Filled 2015-02-08: qty 4

## 2015-02-08 MED ORDER — PHENYLEPHRINE HCL 10 MG/ML IJ SOLN
INTRAMUSCULAR | Status: DC | PRN
  Administered 2015-02-08 (×2): 80 ug via INTRAVENOUS

## 2015-02-08 MED ORDER — MIDAZOLAM HCL 2 MG/2ML IJ SOLN
INTRAMUSCULAR | Status: AC
  Filled 2015-02-08: qty 2

## 2015-02-08 MED ORDER — MIDAZOLAM HCL 5 MG/5ML IJ SOLN
INTRAMUSCULAR | Status: DC | PRN
  Administered 2015-02-08: 2 mg via INTRAVENOUS

## 2015-02-08 MED ORDER — OXYCODONE HCL 5 MG PO TABS
ORAL_TABLET | ORAL | Status: AC
  Filled 2015-02-08: qty 2

## 2015-02-08 MED ORDER — METOCLOPRAMIDE HCL 5 MG/ML IJ SOLN: 5.0000 mg | Freq: Three times a day (TID) | INTRAMUSCULAR | Status: DC | PRN

## 2015-02-08 MED ORDER — HYDROMORPHONE HCL 1 MG/ML IJ SOLN
0.2500 mg | INTRAMUSCULAR | Status: DC | PRN
  Administered 2015-02-08 (×4): 0.5 mg via INTRAVENOUS

## 2015-02-08 MED ORDER — PROPOFOL 10 MG/ML IV BOLUS
INTRAVENOUS | Status: DC | PRN
  Administered 2015-02-08: 200 mg via INTRAVENOUS

## 2015-02-08 MED ORDER — METHOCARBAMOL 1000 MG/10ML IJ SOLN
500.0000 mg | Freq: Four times a day (QID) | INTRAVENOUS | Status: DC | PRN
  Filled 2015-02-08: qty 5

## 2015-02-08 MED ORDER — ONDANSETRON HCL 4 MG/2ML IJ SOLN
INTRAMUSCULAR | Status: AC
  Administered 2015-02-08: 4 mg
  Filled 2015-02-08: qty 2

## 2015-02-08 MED ORDER — KETOROLAC TROMETHAMINE 30 MG/ML IJ SOLN
INTRAMUSCULAR | Status: AC
  Filled 2015-02-08: qty 1

## 2015-02-08 MED ORDER — HYDROMORPHONE HCL 1 MG/ML IJ SOLN
INTRAMUSCULAR | Status: AC
  Administered 2015-02-08: 0.5 mg via INTRAVENOUS
  Filled 2015-02-08: qty 1

## 2015-02-08 MED ORDER — LIDOCAINE-EPINEPHRINE (PF) 1.5 %-1:200000 IJ SOLN
INTRAMUSCULAR | Status: DC | PRN
  Administered 2015-02-08: 10 mL via PERINEURAL

## 2015-02-08 MED ORDER — KETOROLAC TROMETHAMINE 30 MG/ML IJ SOLN
INTRAMUSCULAR | Status: DC | PRN
Start: 2015-02-08 — End: 2015-02-08
  Administered 2015-02-08: 30 mg via INTRAVENOUS

## 2015-02-08 MED ORDER — 0.9 % SODIUM CHLORIDE (POUR BTL) OPTIME
TOPICAL | Status: DC | PRN
  Administered 2015-02-08: 1000 mL

## 2015-02-08 MED ORDER — OXYCODONE HCL 5 MG PO TABS
5.0000 mg | ORAL_TABLET | ORAL | Status: DC | PRN
  Administered 2015-02-08 – 2015-02-13 (×20): 10 mg via ORAL
  Filled 2015-02-08 (×20): qty 2

## 2015-02-08 MED ORDER — PROPOFOL 10 MG/ML IV BOLUS
INTRAVENOUS | Status: AC
  Filled 2015-02-08: qty 20

## 2015-02-08 MED ORDER — EPHEDRINE SULFATE 50 MG/ML IJ SOLN
INTRAMUSCULAR | Status: DC | PRN
  Administered 2015-02-08: 10 mg via INTRAVENOUS

## 2015-02-08 MED ORDER — FENTANYL CITRATE (PF) 250 MCG/5ML IJ SOLN
INTRAMUSCULAR | Status: AC
  Filled 2015-02-08: qty 5

## 2015-02-08 MED ORDER — LACTATED RINGERS IV SOLN
INTRAVENOUS | Status: DC
  Administered 2015-02-08: 11:00:00 via INTRAVENOUS

## 2015-02-08 MED ORDER — LIDOCAINE HCL (CARDIAC) 20 MG/ML IV SOLN
INTRAVENOUS | Status: AC
  Filled 2015-02-08: qty 5

## 2015-02-08 MED ORDER — LIDOCAINE HCL (CARDIAC) 20 MG/ML IV SOLN
INTRAVENOUS | Status: DC | PRN
  Administered 2015-02-08: 100 mg via INTRAVENOUS

## 2015-02-08 MED ORDER — ACETAMINOPHEN 650 MG RE SUPP: 650.0000 mg | Freq: Four times a day (QID) | RECTAL | Status: DC | PRN

## 2015-02-08 MED ORDER — SODIUM CHLORIDE 0.9 % IV SOLN
INTRAVENOUS | Status: DC
  Administered 2015-02-08: 23:00:00 via INTRAVENOUS

## 2015-02-08 MED ORDER — METOCLOPRAMIDE HCL 5 MG PO TABS
5.0000 mg | ORAL_TABLET | Freq: Three times a day (TID) | ORAL | Status: DC | PRN
Start: 2015-02-08 — End: 2015-02-13

## 2015-02-08 MED ORDER — LABETALOL HCL 5 MG/ML IV SOLN
INTRAVENOUS | Status: DC | PRN
  Administered 2015-02-08: 5 mg via INTRAVENOUS

## 2015-02-08 MED ORDER — CEFAZOLIN SODIUM 1-5 GM-% IV SOLN
1.0000 g | Freq: Four times a day (QID) | INTRAVENOUS | Status: AC
  Administered 2015-02-08 – 2015-02-09 (×3): 1 g via INTRAVENOUS
  Filled 2015-02-08 (×3): qty 50

## 2015-02-08 MED ORDER — ONDANSETRON HCL 4 MG/2ML IJ SOLN
INTRAMUSCULAR | Status: DC | PRN
  Administered 2015-02-08: 4 mg via INTRAVENOUS

## 2015-02-08 MED ORDER — ACETAMINOPHEN 325 MG PO TABS: 650.0000 mg | ORAL_TABLET | Freq: Four times a day (QID) | ORAL | Status: DC | PRN

## 2015-02-08 MED ORDER — METHOCARBAMOL 500 MG PO TABS
500.0000 mg | ORAL_TABLET | Freq: Four times a day (QID) | ORAL | Status: DC | PRN
  Administered 2015-02-08 – 2015-02-12 (×8): 500 mg via ORAL
  Filled 2015-02-08 (×8): qty 1

## 2015-02-08 MED ORDER — ROPIVACAINE HCL 5 MG/ML IJ SOLN
INTRAMUSCULAR | Status: DC | PRN
  Administered 2015-02-08: 20 mL via PERINEURAL

## 2015-02-08 MED ORDER — CHLORHEXIDINE GLUCONATE 4 % EX LIQD
60.0000 mL | Freq: Once | CUTANEOUS | Status: DC
  Filled 2015-02-08: qty 60

## 2015-02-08 MED ORDER — PROMETHAZINE HCL 25 MG/ML IJ SOLN: 6.2500 mg | INTRAMUSCULAR | Status: DC | PRN

## 2015-02-08 MED ORDER — SUCCINYLCHOLINE CHLORIDE 20 MG/ML IJ SOLN
INTRAMUSCULAR | Status: AC
  Filled 2015-02-08: qty 1

## 2015-02-08 MED ORDER — FENTANYL CITRATE (PF) 100 MCG/2ML IJ SOLN
INTRAMUSCULAR | Status: DC | PRN
  Administered 2015-02-08 (×2): 50 ug via INTRAVENOUS
  Administered 2015-02-08: 100 ug via INTRAVENOUS
  Administered 2015-02-08: 50 ug via INTRAVENOUS

## 2015-02-08 MED ORDER — SUCCINYLCHOLINE CHLORIDE 20 MG/ML IJ SOLN
INTRAMUSCULAR | Status: DC | PRN
  Administered 2015-02-08: 130 mg via INTRAVENOUS

## 2015-02-08 MED ORDER — HYDROMORPHONE HCL 1 MG/ML IJ SOLN
1.0000 mg | INTRAMUSCULAR | Status: DC | PRN
  Administered 2015-02-08 – 2015-02-10 (×7): 1 mg via INTRAVENOUS
  Filled 2015-02-08 (×7): qty 1

## 2015-02-08 MED ORDER — KETOROLAC TROMETHAMINE 30 MG/ML IJ SOLN: 30.0000 mg | Freq: Once | INTRAMUSCULAR | Status: DC | PRN

## 2015-02-08 SURGICAL SUPPLY — 58 items
BANDAGE ESMARK 6X9 LF (GAUZE/BANDAGES/DRESSINGS) IMPLANT
BIT DRILL 2.5 X LONG (BIT) ×1
BIT DRILL LCP QC 2X140 (BIT) ×1 IMPLANT
BIT DRILL X LONG 2.5 (BIT) IMPLANT
BNDG CMPR 9X6 STRL LF SNTH (GAUZE/BANDAGES/DRESSINGS)
BNDG COHESIVE 4X5 TAN STRL (GAUZE/BANDAGES/DRESSINGS) ×1 IMPLANT
BNDG ESMARK 6X9 LF (GAUZE/BANDAGES/DRESSINGS)
BNDG GAUZE ELAST 4 BULKY (GAUZE/BANDAGES/DRESSINGS) ×1 IMPLANT
CANISTER WOUND CARE 500ML ATS (WOUND CARE) ×1 IMPLANT
COVER SURGICAL LIGHT HANDLE (MISCELLANEOUS) ×3 IMPLANT
CUFF TOURNIQUET SINGLE 34IN LL (TOURNIQUET CUFF) IMPLANT
CUFF TOURNIQUET SINGLE 44IN (TOURNIQUET CUFF) IMPLANT
DRAPE INCISE IOBAN 66X45 STRL (DRAPES) ×2 IMPLANT
DRAPE OEC MINIVIEW 54X84 (DRAPES) ×1 IMPLANT
DRAPE PROXIMA HALF (DRAPES) ×2 IMPLANT
DRAPE U-SHAPE 47X51 STRL (DRAPES) ×2 IMPLANT
DRILL BIT X LONG 2.5 (BIT) ×2
DRSG ADAPTIC 3X8 NADH LF (GAUZE/BANDAGES/DRESSINGS) ×1 IMPLANT
DRSG MEPILEX BORDER 4X4 (GAUZE/BANDAGES/DRESSINGS) ×1 IMPLANT
DRSG PAD ABDOMINAL 8X10 ST (GAUZE/BANDAGES/DRESSINGS) ×1 IMPLANT
DRSG VAC ATS SM SENSATRAC (GAUZE/BANDAGES/DRESSINGS) ×1 IMPLANT
DURAPREP 26ML APPLICATOR (WOUND CARE) ×2 IMPLANT
ELECT REM PT RETURN 9FT ADLT (ELECTROSURGICAL) ×2
ELECTRODE REM PT RTRN 9FT ADLT (ELECTROSURGICAL) ×1 IMPLANT
GAUZE SPONGE 4X4 12PLY STRL (GAUZE/BANDAGES/DRESSINGS) ×1 IMPLANT
GLOVE BIOGEL PI IND STRL 9 (GLOVE) ×1 IMPLANT
GLOVE BIOGEL PI INDICATOR 9 (GLOVE) ×1
GLOVE SURG ORTHO 9.0 STRL STRW (GLOVE) ×2 IMPLANT
GOWN STRL REUS W/ TWL XL LVL3 (GOWN DISPOSABLE) ×3 IMPLANT
GOWN STRL REUS W/TWL XL LVL3 (GOWN DISPOSABLE) ×6
KIT BASIN OR (CUSTOM PROCEDURE TRAY) ×2 IMPLANT
KIT ROOM TURNOVER OR (KITS) ×2 IMPLANT
MANIFOLD NEPTUNE II (INSTRUMENTS) ×1 IMPLANT
NS IRRIG 1000ML POUR BTL (IV SOLUTION) ×2 IMPLANT
PACK ORTHO EXTREMITY (CUSTOM PROCEDURE TRAY) ×2 IMPLANT
PAD ARMBOARD 7.5X6 YLW CONV (MISCELLANEOUS) ×3 IMPLANT
PLATE DIST TIBIA 6H 2.7/3.5MM (Plate) ×1 IMPLANT
PLATE LCP 3.5 1/3 TUB 5HX57 (Plate) ×1 IMPLANT
SCREW CORTEX 3.5 12MM (Screw) ×2 IMPLANT
SCREW CORTEX 3.5 14MM (Screw) ×1 IMPLANT
SCREW CORTEX 3.5 16MM (Screw) ×2 IMPLANT
SCREW CORTEX LOW PRO 3.5X30 (Screw) ×1 IMPLANT
SCREW CORTEX LOW PRO 3.5X32 (Screw) ×1 IMPLANT
SCREW LOCK CORT ST 3.5X12 (Screw) IMPLANT
SCREW LOCK CORT ST 3.5X14 (Screw) IMPLANT
SCREW LOCK CORT ST 3.5X16 (Screw) IMPLANT
SCREW LOCKING VA 2.7X34MM (Screw) ×1 IMPLANT
SCREW LOCKING VA 2.7X40MM (Screw) ×2 IMPLANT
SCREW LOW PROFILE 3.5X36MM (Screw) ×1 IMPLANT
SCREW METAPHYSCAL 34MM (Screw) ×1 IMPLANT
SPONGE LAP 18X18 X RAY DECT (DISPOSABLE) ×2 IMPLANT
STAPLER VISISTAT 35W (STAPLE) IMPLANT
SUCTION FRAZIER TIP 10 FR DISP (SUCTIONS) ×2 IMPLANT
SUT ETHILON 2 0 PSLX (SUTURE) ×1 IMPLANT
SUT VIC AB 2-0 CTB1 (SUTURE) ×4 IMPLANT
TOWEL OR 17X24 6PK STRL BLUE (TOWEL DISPOSABLE) ×2 IMPLANT
TOWEL OR 17X26 10 PK STRL BLUE (TOWEL DISPOSABLE) ×2 IMPLANT
TUBE CONNECTING 12X1/4 (SUCTIONS) ×2 IMPLANT

## 2015-02-08 NOTE — Anesthesia Preprocedure Evaluation (Signed)
Anesthesia Evaluation  Patient identified by MRN, date of birth, ID band Patient awake    Reviewed: Allergy & Precautions, NPO status , Patient's Chart, lab work & pertinent test results  Airway Mallampati: II  TM Distance: >3 FB Neck ROM: Full    Dental no notable dental hx.    Pulmonary Current Smoker,  breath sounds clear to auscultation  Pulmonary exam normal       Cardiovascular negative cardio ROS Normal cardiovascular examRhythm:Regular Rate:Normal     Neuro/Psych negative neurological ROS  negative psych ROS   GI/Hepatic negative GI ROS, Neg liver ROS,   Endo/Other  negative endocrine ROS  Renal/GU negative Renal ROS  negative genitourinary   Musculoskeletal negative musculoskeletal ROS (+)   Abdominal   Peds negative pediatric ROS (+)  Hematology negative hematology ROS (+)   Anesthesia Other Findings   Reproductive/Obstetrics negative OB ROS                             Anesthesia Physical Anesthesia Plan  ASA: II  Anesthesia Plan: General   Post-op Pain Management:    Induction: Intravenous  Airway Management Planned: LMA  Additional Equipment:   Intra-op Plan:   Post-operative Plan: Extubation in OR  Informed Consent: I have reviewed the patients History and Physical, chart, labs and discussed the procedure including the risks, benefits and alternatives for the proposed anesthesia with the patient or authorized representative who has indicated his/her understanding and acceptance.   Dental advisory given  Plan Discussed with: CRNA and Surgeon  Anesthesia Plan Comments:         Anesthesia Quick Evaluation  

## 2015-02-08 NOTE — Progress Notes (Signed)
Patient has a tenuous soft tissue envelope around his left ankle secondary to the crush injury. We will plan for keeping him in the hospital over the weekend and continue the wound VAC.

## 2015-02-08 NOTE — H&P (Signed)
Larry SjogrenSteve Hancock is an 59 y.o. male.   Chief Complaint: Status post external fixation for open pilon fracture left ankle HPI: Patient is a 59 year old gentleman who sustained a crush injury to his left ankle. Patient initially underwent external fixation closed reduction with irrigation and debridement of the open fracture. Due to the trauma to the soft tissue envelope patient was maintained in an external fixator to allow the soft tissue to resolve and presents at this time for open reduction internal fixation of the pilon fracture.  Past Medical History  Diagnosis Date  . Pilon fracture     left  . Headache     Past Surgical History  Procedure Laterality Date  . External fixation leg Left 01/31/2015    Procedure: EXTERNAL FIXATION LEG;  Surgeon: Nadara MustardMarcus Duda V, MD;  Location: [facility] OR;  Service: Orthopedics;  Laterality: Left;  Marland Kitchen. Eye surgery Left     childhood accident   / blind in left eye  . Wisdom tooth extraction      History reviewed. No pertinent family history. Social History:  reports that he has been smoking Cigarettes.  He has been smoking about 0.00 packs per day. He has never used smokeless tobacco. He reports that he does not drink alcohol or use illicit drugs.  Allergies: No Known Allergies  No prescriptions prior to admission    No results found for this or any previous visit (from the past 48 hour(s)). No results found.  Review of Systems  All other systems reviewed and are negative.   There were no vitals taken for this visit. Physical Exam  On examination patient has a stable external fixator the soft tissue envelope is improving he still has some ischemic changes medially and has the open laceration laterally. Assessment/Plan Assessment: Open grade 1 left pilon fracture status post external fixation.  Plan: We will plan for removal of external fixator open reduction internal fixation of the intra-articular tibial pilon fracture and open reduction internal fixation  of the open fibular fracture. Risk and benefits were discussed including risk of the wound not healing arthritis need for additional surgery. Patient states he understands wishes to proceed at this time.  DUDA,MARCUS V 02/08/2015, 6:51 AM

## 2015-02-08 NOTE — Op Note (Signed)
02/08/2015  2:29 PM  PATIENT:  Larry Hancock    PRE-OPERATIVE DIAGNOSIS:  Left Pilon Fracture  POST-OPERATIVE DIAGNOSIS:  Same  PROCEDURE:  REMOVAL EXTERNAL FIXATION LEG, OPEN REDUCTION INTERNAL FIXATION (ORIF) TIBIA/FIBULA  Application of wound VAC incisional.  SURGEON:  Keita Demarco V, MD  PHYSICIAN ASSISTANT:None ANESTHESIA:   General  PREOPERATIVE INDICATIONS:  Larry SjogrenSteve Tennant is a  59 y.o. male with a diagnosis of Left Pilon Fracture who failed conservative measures and elected for surgical management.    The risks benefits and alternatives were discussed with the patient preoperatively including but not limited to the risks of infection, bleeding, nerve injury, cardiopulmonary complications, the need for revision surgery, among others, and the patient was willing to proceed.  OPERATIVE IMPLANTS: Anterior lateral tibial plate and one third tubular fibular plate  OPERATIVE FINDINGS: Necrotic muscle in the anterior compartment secondary to the crush injury. Concern for wound healing due to the necrotic skin over the medial malleolus and necrotic wounds laterally secondary to the crush injury.  OPERATIVE PROCEDURE:  Patient was brought to the operating room and underwent a general anesthetic. A timeout was called. The pin tracks were sterilely prepped with Betadine and the external fixator was removed without complications. The left lower extremity was then prepped using DuraPrep draped into a sterile field. An anterior lateral incision was made carried down to the joint line in curved medially. The retinaculum was released to allow for exposure of the distal tibia and fibula. The superficial peroneal nerve was protected. The pilon fracture was distracted irrigated bony fragments were removed the fracture was then reduced and stabilized with anterior lateral plate. 4 locking screws distally and 3 compression screws proximally C-arm fluoroscopy verified alignment. The fibula was then reduced as  well and stabilized with a 5 hole one third tubular plate with 2 compression screws proximally and 2 compression screws distally. C-arm fluoroscopy verified alignment. Further necrotic muscle was debrided from the anterior compartment. The wound was irrigated with normal saline. The incision was closed using 2-0 nylon there was approximately a 3 mm gap that was left open not to put too much tension on the skin. A wound VAC was applied covered with Ioban. The 10 tracks were covered with Mepilex dressing. Patient was extubated taken to the PACU in stable condition.

## 2015-02-08 NOTE — ED Provider Notes (Signed)
CSN: [id number]     Arrival date & time 01/31/15  1548 History   First MD Initiated Contact with Patient 01/31/15 1550     Chief Complaint  Patient presents with  . Ankle Injury      HPI Pt arrives via EMS. Pt was at work standing on a pallet when a pallet jack caught his leg between the pallet. Pt arrives with obvious deformity to left ankle with abrasions that are actively bleeding. Dr Radford PaxBeaton in room. Good pulses. Past Medical History  Diagnosis Date  . Pilon fracture     left  . Headache    Past Surgical History  Procedure Laterality Date  . External fixation leg Left 01/31/2015    Procedure: EXTERNAL FIXATION LEG;  Surgeon: Nadara MustardMarcus Duda V, MD;  Location: [facility] OR;  Service: Orthopedics;  Laterality: Left;  Marland Kitchen. Eye surgery Left     childhood accident   / blind in left eye  . Wisdom tooth extraction     History reviewed. No pertinent family history. History  Substance Use Topics  . Smoking status: Light Tobacco Smoker -- 0.00 packs/day    Types: Cigarettes  . Smokeless tobacco: Never Used  . Alcohol Use: No    Review of Systems  All other systems reviewed and are negative.     Allergies  Review of patient's allergies indicates no known allergies.  Home Medications   Prior to Admission medications   Medication Sig Start Date End Date Taking? Authorizing Provider  ibuprofen (ADVIL,MOTRIN) 200 MG tablet Take 200 mg by mouth every 6 (six) hours as needed for headache.   Yes Historical Provider, MD  oxyCODONE-acetaminophen (ROXICET) 5-325 MG per tablet Take 1 tablet by mouth every 4 (four) hours as needed for severe pain. 02/02/15   Nadara MustardMarcus Duda V, MD   BP 181/92 mmHg  Pulse 81  Temp(Src) 98.7 F (37.1 C) (Oral)  Resp 18  Ht 5\' 9"  (1.753 m)  Wt 195 lb (88.451 kg)  BMI 28.78 kg/m2  SpO2 98% Physical Exam  Constitutional: He is oriented to person, place, and time. He appears well-developed and well-nourished. No distress.  HENT:  Head: Normocephalic and atraumatic.    Eyes: Pupils are equal, round, and reactive to light.  Neck: Normal range of motion.  Cardiovascular: Normal rate and intact distal pulses.   Pulmonary/Chest: No respiratory distress.  Abdominal: Normal appearance. He exhibits no distension.  Musculoskeletal:       Left lower leg: He exhibits tenderness, bony tenderness, swelling, deformity and laceration.       Legs: Neurological: He is alert and oriented to person, place, and time. No cranial nerve deficit.  Skin: Skin is warm and dry. No rash noted.  Psychiatric: He has a normal mood and affect. His behavior is normal.  Nursing note and vitals reviewed.   ED Course  Procedures (including critical care time)  Medications  ketorolac (TORADOL) 30 MG/ML injection (not administered)  Tdap (BOOSTRIX) injection 0.5 mL (0.5 mLs Intramuscular Given 01/31/15 1616)  ceFAZolin (ANCEF) IVPB 1 g/50 mL premix (1 g Intravenous New Bag/Given 01/31/15 1707)  gentamicin (GARAMYCIN) 130 mg in dextrose 5 % 50 mL IVPB (130 mg Intravenous Given 01/31/15 1753)  ketorolac (TORADOL) 30 MG/ML injection 30 mg (30 mg Intravenous Given 01/31/15 2047)  CRITICAL CARE Performed by: Nelva NayBEATON,Yarielis Funaro L Total critical care time: 30 min Critical care time was exclusive of separately billable procedures and treating other patients. Critical care was necessary to treat or prevent imminent or life-threatening deterioration.  Critical care was time spent personally by me on the following activities: development of treatment plan with patient and/or surrogate as well as nursing, discussions with consultants, evaluation of patient's response to treatment, examination of patient, obtaining history from patient or surrogate, ordering and performing treatments and interventions, ordering and review of laboratory studies, ordering and review of radiographic studies, pulse oximetry and re-evaluation of patient's condition.   Labs Review Labs Reviewed - No data to display  Imaging  Review     DG Ankle Complete Left (Final result) Result time: 01/31/15 16:16:32   Final result by Rad Results In Interface (01/31/15 16:16:32)   Narrative:   CLINICAL DATA: Pain following fall. Machine crossed over ankle after fall  EXAM: LEFT ANKLE COMPLETE - 3+ VIEW  COMPARISON: None.  FINDINGS: Frontal, oblique, and lateral views obtained. There is a comminuted fracture of the distal tibial metaphysis -diaphysis junction with posterior displacement of the major distal fracture fragment compared to the major proximal fragment. There is a comminuted spiral fracture of the distal fibular diaphysis, with medial displacement of the distal major fracture fragment with respect major proximal fragment. The ankle mortise appears intact. There is extensive soft tissue air in the lower extremity region. There is no appreciable joint space narrowing.  IMPRESSION: Comminuted fractures of the distal tibia and fibula. Extensive soft tissue air in the left lower extremity. Ankle mortise appears intact.   Electronically Signed By: Bretta BangWilliam Woodruff III M.D. On: 01/31/2015 16:16       MDM   Final diagnoses:  Open pilon fracture, left, type I or II, initial encounter        Nelva Nayobert Kathryn Cosby, MD 02/08/15 1101

## 2015-02-08 NOTE — Transfer of Care (Signed)
Immediate Anesthesia Transfer of Care Note  Patient: Larry SjogrenSteve Broy  Procedure(s) Performed: Procedure(s): REMOVAL EXTERNAL FIXATION LEG (Left) OPEN REDUCTION INTERNAL FIXATION (ORIF) TIBIA/FIBULA  (Left)  Patient Location: PACU  Anesthesia Type:General  Level of Consciousness: awake, alert  and oriented  Airway & Oxygen Therapy: Patient Spontanous Breathing and Patient connected to nasal cannula oxygen  Post-op Assessment: Report given to RN and Post -op Vital signs reviewed and stable  Post vital signs: Reviewed and stable  Last Vitals:  Filed Vitals:   02/08/15 1430  BP: 171/96  Pulse: 80  Temp: 36.7 C  Resp: 13    Complications: No apparent anesthesia complications

## 2015-02-08 NOTE — Anesthesia Postprocedure Evaluation (Signed)
  Anesthesia Post-op Note  Patient: Larry SjogrenSteve Aguino  Procedure(s) Performed: Procedure(s) (LRB): REMOVAL EXTERNAL FIXATION LEG (Left) OPEN REDUCTION INTERNAL FIXATION (ORIF) TIBIA/FIBULA  (Left)  Patient Location: PACU  Anesthesia Type: GA combined with regional for post-op pain  Level of Consciousness: awake and alert   Airway and Oxygen Therapy: Patient Spontanous Breathing  Post-op Pain: mild  Post-op Assessment: Post-op Vital signs reviewed, Patient's Cardiovascular Status Stable, Respiratory Function Stable, Patent Airway and No signs of Nausea or vomiting  Last Vitals:  Filed Vitals:   02/08/15 1515  BP: 191/104  Pulse: 75  Temp:   Resp: 17    Post-op Vital Signs: stable   Complications: No apparent anesthesia complications

## 2015-02-08 NOTE — Anesthesia Procedure Notes (Addendum)
Procedure Name: Intubation Date/Time: 02/08/2015 1:00 PM Performed by: Leonel Ramsay'LAUGHLIN, KAREN H Pre-anesthesia Checklist: Patient identified, Emergency Drugs available, Suction available, Patient being monitored and Timeout performed Patient Re-evaluated:Patient Re-evaluated prior to inductionOxygen Delivery Method: Circle system utilized Preoxygenation: Pre-oxygenation with 100% oxygen Intubation Type: IV induction Ventilation: Two handed mask ventilation required and Oral airway inserted - appropriate to patient size Laryngoscope Size: Mac and 4 Grade View: Grade II Tube type: Oral Tube size: 7.5 mm Number of attempts: 1 Airway Equipment and Method: Stylet and Oral airway Placement Confirmation: ETT inserted through vocal cords under direct vision,  positive ETCO2 and breath sounds checked- equal and bilateral Secured at: 23 cm Tube secured with: Tape Dental Injury: Teeth and Oropharynx as per pre-operative assessment    Anesthesia Regional Block:  Femoral nerve block  Pre-Anesthetic Checklist: ,, timeout performed, Correct Patient, Correct Site, Correct Laterality, Correct Procedure, Correct Position, site marked, Risks and benefits discussed,  Surgical consent,  Pre-op evaluation,  At surgeon's request and post-op pain management  Laterality: Left  Prep: chloraprep       Needles:  Injection technique: Single-shot  Needle Type: Echogenic Stimulator Needle     Needle Length: 9cm 9 cm Needle Gauge: 21 and 21 G    Additional Needles:  Procedures: ultrasound guided (picture in chart) Femoral nerve block Narrative:  Injection made incrementally with aspirations every 5 mL.  Performed by: Personally   Additional Notes: Patient tolerated the procedure well without complications

## 2015-02-08 NOTE — Progress Notes (Signed)
Orthopedic Tech Progress Note Patient Details:  Larry SjogrenSteve Hancock Mar 03, 1956 [id number]  Ortho Devices Type of Ortho Device: CAM walker Ortho Device/Splint Location: LLE Ortho Device/Splint Interventions: Casandra DoffingOrdered   [name] Craig 02/08/2015, 6:34 PM

## 2015-02-08 NOTE — Progress Notes (Signed)
Pt denies being diagnosed with hypertension but does not have a PCP, denies taking medication for hypertension. However, blood pressure 187/107 and 193/111 while in preop; therefore EKG done per Dr. Okey Dupreose.

## 2015-02-09 ENCOUNTER — Encounter (HOSPITAL_COMMUNITY): Payer: Self-pay | Admitting: Radiology

## 2015-02-09 ENCOUNTER — Inpatient Hospital Stay (HOSPITAL_COMMUNITY): Payer: Worker's Compensation

## 2015-02-09 MED ORDER — RIVAROXABAN 10 MG PO TABS
15.0000 mg | ORAL_TABLET | Freq: Two times a day (BID) | ORAL | Status: DC
  Administered 2015-02-09 – 2015-02-10 (×2): 15 mg via ORAL
  Filled 2015-02-09 (×3): qty 2

## 2015-02-09 MED ORDER — IOHEXOL 350 MG/ML SOLN
75.0000 mL | Freq: Once | INTRAVENOUS | Status: AC | PRN
  Administered 2015-02-09: 75 mL via INTRAVENOUS

## 2015-02-09 MED ORDER — PIPERACILLIN-TAZOBACTAM 3.375 G IVPB
3.3750 g | Freq: Three times a day (TID) | INTRAVENOUS | Status: DC
  Administered 2015-02-09 – 2015-02-13 (×12): 3.375 g via INTRAVENOUS
  Filled 2015-02-09 (×15): qty 50

## 2015-02-09 MED ORDER — VANCOMYCIN HCL IN DEXTROSE 1-5 GM/200ML-% IV SOLN
1000.0000 mg | Freq: Two times a day (BID) | INTRAVENOUS | Status: DC
  Administered 2015-02-09 – 2015-02-13 (×8): 1000 mg via INTRAVENOUS
  Filled 2015-02-09 (×13): qty 200

## 2015-02-09 NOTE — Evaluation (Signed)
Physical Therapy Evaluation Patient Details Name: Larry Hancock MRN: [id number] DOB: 09-29-1956 Today's Date: 02/09/2015   History of Present Illness  Pt is a 59 y/o M s/p L tib fib grade I pilon fx now w/ ORIF. Pt has no pertinent PMH besides being a current smoker.  Clinical Impression  Patient is s/p above surgery resulting in functional limitations due to the deficits listed below (see PT Problem List). Pt limited to ambulating 5 ft this session 2/2 pain in LLE.  Pt completed therapeutic exercises in bed prior to ambulation.  [facility] and demonstrated ability to ascend/descend 1 step but will benefit from practicing this again during next session, along w/ using the knee scooter, as appropriate. Patient will benefit from skilled PT to increase their independence and safety with mobility to allow discharge to the venue listed below.      Follow Up Recommendations Outpatient PT;Supervision - Intermittent    Equipment Recommendations  Other (comment) (knee walker)    Recommendations for Other Services       Precautions / Restrictions Precautions Precautions: Fall Restrictions Weight Bearing Restrictions: Yes LLE Weight Bearing: Non weight bearing      Mobility  Bed Mobility Overal bed mobility: Needs Assistance Bed Mobility: Supine to Sit     Supine to sit: Min assist     General bed mobility comments: Min assist to pull up to sitting w/ trunk leaning posteriorly during supine>sit.  Verbal cues for technique.  Transfers Overall transfer level: Needs assistance Equipment used: Rolling walker (2 wheeled) Transfers: Sit to/from Stand Sit to Stand: Min guard         General transfer comment: Good carryover from last week's sessions.  Reminder to push from bed rather than pull on RW.  Ambulation/Gait Ambulation/Gait assistance: Supervision Ambulation Distance (Feet): 5 Feet Assistive device: Rolling walker (2 wheeled) Gait Pattern/deviations:  Antalgic;Trunk flexed   Gait velocity interpretation: Below normal speed for age/gender General Gait Details: hop on RLE  Stairs            Wheelchair Mobility    Modified Rankin (Stroke Patients Only)       Balance Overall balance assessment: Needs assistance Sitting-balance support: Feet supported;Bilateral upper extremity supported (RLE supported) Sitting balance-Leahy Scale: Fair   Postural control: Posterior lean Standing balance support: Bilateral upper extremity supported;During functional activity Standing balance-Leahy Scale: Fair                               Pertinent Vitals/Pain Pain Assessment: 0-10 Pain Score: 3  Pain Location: L ankle Pain Descriptors / Indicators: Aching;Throbbing Pain Intervention(s): Limited activity within patient's tolerance;Monitored during session;Repositioned    Home Living Family/patient expects to be discharged to:: Private residence Living Arrangements: Alone Available Help at Discharge: Family;Available PRN/intermittently (sisters coming over every day) Type of Home: House Home Access: Stairs to enter Entrance Stairs-Rails: None Entrance Stairs-Number of Steps: 1 Home Layout: One level Home Equipment: Walker - 2 wheels;Bedside commode      Prior Function Level of Independence: Independent with assistive device(s)         Comments: Independent prior to injury.  Since injury has been using RW independently     Hand Dominance   Dominant Hand: Right    Extremity/Trunk Assessment               Lower Extremity Assessment: LLE deficits/detail   LLE Deficits / Details: weakness and  limited ROM as expected s/p L ankle ORIF     Communication   Communication: No difficulties  Cognition Arousal/Alertness: Awake/alert Behavior During Therapy: WFL for tasks assessed/performed Overall Cognitive Status: Within Functional Limits for tasks assessed                      General Comments  General comments (skin integrity, edema, etc.): Pt was at [facility] last week and demonstrated ability to ascend/descend 1 step.  Pt has been ambulating w/ RW and ascending/descending 1 step at home independently w/ RW.    Exercises Total Joint Exercises Ankle Circles/Pumps: AROM;Right;10 reps;Supine Straight Leg Raises: AROM;Both;10 reps;Supine      Assessment/Plan    PT Assessment Patient needs continued PT services  PT Diagnosis Difficulty walking;Abnormality of gait;Generalized weakness;Acute pain   PT Problem List Decreased strength;Decreased range of motion;Decreased activity tolerance;Decreased balance;Decreased mobility;Decreased coordination;Decreased knowledge of use of DME;Decreased safety awareness;Decreased knowledge of precautions;Impaired sensation;Decreased skin integrity;Pain  PT Treatment Interventions DME instruction;Gait training;Stair training;Functional mobility training;Therapeutic activities;Therapeutic exercise;Balance training;Neuromuscular re-education;Patient/family education;Modalities   PT Goals (Current goals can be found in the Care Plan section) Acute Rehab PT Goals Patient Stated Goal: to get stronger PT Goal Formulation: With patient Time For Goal Achievement: 02/16/15 Potential to Achieve Goals: Good    Frequency Min 5X/week   Barriers to discharge Inaccessible home environment;Decreased caregiver support Intermittent assist at home; 1 step to enter home    Co-evaluation               End of Session Equipment Utilized During Treatment: Gait belt Activity Tolerance: Patient limited by pain Patient left: in chair;with call bell/phone within reach Nurse Communication: Mobility status;Precautions;Weight bearing status         Time: 1230-1252 PT Time Calculation (min) (ACUTE ONLY): 22 min   Charges:   PT Evaluation $Initial PT Evaluation Tier I: 1 Procedure     PT G CodesMichail Jewels:       Chaniyah Jahr Parr PT, DPT [phone] Pager:  [phone] 02/09/2015, 1:26 PM

## 2015-02-09 NOTE — Progress Notes (Signed)
Pt called RN into room. Pt diaphoretic, reported dizziness, nausea, pain in LLE. LLE further assessed, leg swollen, blancheable redness, pulses 1+, pt reported decreased sensation in L foot but still able to slightly wiggle toes. RN repositioned/elevated LLE. Dr. Lajoyce Cornersuda notified immediately and aware of situation. Orders received for stat doppler LLE to r/o DVT, CT chest to r/o PE, IV Abx, Xarelto. Dr. Lajoyce Cornersuda spoke to pt on the phone to update pt on plan of care. Oncoming shift RN aware of current situation. VSS. Pt then reported that he felt much better and no longer has dizziness, diaphoresis, nausea, and reported great improvement in pain. Pt now reports sensation in LLE and is able to still wiggle toes. Dr. Lajoyce Cornersuda updated. Still to continue with new orders. Will continue to monitor.

## 2015-02-09 NOTE — Progress Notes (Signed)
Subjective: Pt stable - pain ok - wound vac in place   Objective: Vital signs in last 24 hours: Temp:  [97.8 F (36.6 C)-98.3 F (36.8 C)] 98 F (36.7 C) (05/12 0615) Pulse Rate:  [71-86] 77 (05/12 0615) Resp:  [10-24] 15 (05/11 1830) BP: (151-203)/(77-117) 171/96 mmHg (05/12 0615) SpO2:  [94 %-100 %] 100 % (05/12 0615) Weight:  [88.451 kg (195 lb)] 88.451 kg (195 lb) (05/11 0945)  Intake/Output from previous day: 05/11 0701 - 05/12 0700 In: 1705.3 [P.O.:290; I.V.:1265.3; IV Piggyback:150] Out: 1450 [Urine:1400; Blood:50] Intake/Output this shift:    Exam:  Sensation intact distally Compartment soft  Labs:  Recent Labs  02/08/15 1032  HGB 12.7*    Recent Labs  02/08/15 1032  WBC 9.9  RBC 4.30  HCT 37.8*  PLT 241    Recent Labs  02/08/15 1032  NA 138  K 4.4  CL 103  CO2 24  BUN 18  CREATININE 1.01  GLUCOSE 99  CALCIUM 8.9    Recent Labs  02/08/15 1032  INR 1.04    Assessment/Plan: Plan to keep inpatient over weekend - soft tissue envelope not yet degrading - wound vac minimal drainage   DEAN,GREGORY SCOTT 02/09/2015, 8:15 AM

## 2015-02-09 NOTE — Progress Notes (Signed)
Pt recently ambulated to chair w/ PT. Pt had increased pain with the activity but overall was tolerating the chair well. Pt given pain medication via IV for severe pain. Pt previously tolerated doses of the narcotic well. Pt became diaphoretic, increased RR, and stated he felt like he was going to pass out. Pt did not pass out, was arousable, able to MAE and was assisted back to bed with two RNs. Pt remained NWB on LLE. Pt reported that he felt better and pain was starting to decrease. VSS. Pt resting comfortably and in NAD.

## 2015-02-09 NOTE — Consult Note (Signed)
PHARMACY FOLLOW UP CONSULT NOTE  Pharmacy Consult for :  Vancomycin ;  Xarelto Indication:  Ankle Wound infection;  Possible DVT  Hospital Problems Active Problems:   Pilon fracture of left tibia   CURRENTLY: Dosing Weight: 88.5 kg  97.5 F (36.4 C) (Oral)  LABS:  Recent Labs Lab 02/08/15 1032  NA 138  K 4.4  CL 103  CO2 24  GLUCOSE 99  BUN 18  CREATININE 1.01  CALCIUM 8.9  HGB 12.7*  HCT 37.8*  MCV 87.9  PLT 241  INR 1.04    CrCl: Estimated Creatinine Clearance: 86.7 mL/min (by C-G formula based on Cr of 1.01).  MICROBIOLOGY: No results found for: CULT No results for input(s): CULT, SDES in the last 168 hours.  MEDICATIONS: Prior to Admission: Prescriptions prior to admission  Medication Sig Dispense Refill Last Dose  . ibuprofen (ADVIL,MOTRIN) 200 MG tablet Take 200 mg by mouth every 6 (six) hours as needed for headache.   02/07/2015 at Unknown time  . oxyCODONE-acetaminophen (ROXICET) 5-325 MG per tablet Take 1 tablet by mouth every 4 (four) hours as needed for severe pain. 60 tablet 0 02/07/2015 at Unknown time   Scheduled:  Scheduled:  . aspirin EC  325 mg Oral Daily  . piperacillin-tazobactam (ZOSYN)  IV  3.375 g Intravenous Q8H  . Rivaroxaban  15 mg Oral BID WC  . vancomycin  1,000 mg Intravenous Q12H   Infusion[s]: Infusions:  . sodium chloride 10 mL/hr at 02/08/15 2328   Antibiotic[s]: Anti-infectives    Start     Dose/Rate Route Frequency Ordered Stop   02/09/15 2015  vancomycin (VANCOCIN) IVPB 1000 mg/200 mL premix     1,000 mg 200 mL/hr over 60 Minutes Intravenous Every 12 hours 02/09/15 2006     02/09/15 2000  piperacillin-tazobactam (ZOSYN) IVPB 3.375 g     3.375 g 12.5 mL/hr over 240 Minutes Intravenous Every 8 hours 02/09/15 1923     02/08/15 1900  ceFAZolin (ANCEF) IVPB 1 g/50 mL premix     1 g 100 mL/hr over 30 Minutes Intravenous Every 6 hours 02/08/15 1743 02/09/15 0730   02/08/15 1130  ceFAZolin (ANCEF) IVPB 2  g/50 mL premix     2 g 100 mL/hr over 30 Minutes Intravenous To Surgery 02/07/15 1333 02/08/15 1300      ASSESSMENT:  59 y.o. male s/p surgeries for ankle crush injury is to be started on Vancomycin for ankle wound infection and Xarelto for concern for developing DVT.  Patient weight 88.5 kg, CrCl > 85 ml/min.  Hgb 12.7, Plts 241.  GOAL:    Heparin Level  0.3 - 0.7 units/ml  Vancomycin trough level 10-15 mcg/ml  PLAN: 1. Begin Xarelto 15 mg po BID x 15 days, then change to Xarelto 20 mg daily. 2. Begin Vancomycin 1000 mg IV q 12 hours. 3. Daily CBC. Monitor for bleeding complications.   Follow Platelet counts. 4. Monitor renal function, WBC, fever curve, any cultures/sensitivities, Vancomycin levels as clinically indicated, and monitor clinical progression.   Velda ShellEarle Javius Sylla,  Pharm.D   02/09/2015,  8:13 PM

## 2015-02-10 ENCOUNTER — Inpatient Hospital Stay (HOSPITAL_COMMUNITY): Payer: Worker's Compensation

## 2015-02-10 ENCOUNTER — Inpatient Hospital Stay (INDEPENDENT_AMBULATORY_CARE_PROVIDER_SITE_OTHER): Payer: Worker's Compensation

## 2015-02-10 DIAGNOSIS — R609 Edema, unspecified: Secondary | ICD-10-CM

## 2015-02-10 MED ORDER — RIVAROXABAN 20 MG PO TABS: 20.0000 mg | ORAL_TABLET | Freq: Every day | ORAL | Status: DC

## 2015-02-10 MED ORDER — RIVAROXABAN 15 MG PO TABS
15.0000 mg | ORAL_TABLET | Freq: Two times a day (BID) | ORAL | Status: DC
  Administered 2015-02-10 – 2015-02-13 (×6): 15 mg via ORAL
  Filled 2015-02-10 (×2): qty 1
  Filled 2015-02-10 (×2): qty 2
  Filled 2015-02-10: qty 1
  Filled 2015-02-10: qty 2
  Filled 2015-02-10: qty 1

## 2015-02-10 NOTE — Progress Notes (Signed)
PT Cancellation Note  Patient Details Name: Levy SjogrenSteve Claud MRN: [id number] DOB: July 04, 1956   Cancelled Treatment:    Reason Eval/Treat Not Completed: Medical issues which prohibited therapy (DVT in LLE). Preliminary findings: Left: DVT noted in the CFV. FV. Popliteal vein, and gastrocnemius veins. Non occlusive in the popliteal vein. PT will continue to follow pt acutely when medically appropriate.  Michail JewelsAshley Parr PT, DPT [phone] Pager: [phone] 02/10/2015, 2:48 PM

## 2015-02-10 NOTE — Discharge Instructions (Signed)
Information on my medicine - XARELTO (rivaroxaban)  This medication education was reviewed with me or my healthcare representative as part of my discharge preparation.  The pharmacist that spoke with me during my hospital stay was:  Kathia Covington, Haskel SchroederLora Poteet, [facility] Hospital[facility]PH  WHY WAS XARELTO PRESCRIBED FOR YOU? Xarelto was prescribed to treat blood clots that may have been found in the veins of your legs (deep vein thrombosis) or in your lungs (pulmonary embolism) and to reduce the risk of them occurring again.  What do you need to know about Xarelto? The starting dose is one 15 mg tablet taken TWICE daily with food for the FIRST 21 DAYS then on 03/02/14  the dose is changed to one 20 mg tablet taken ONCE A DAY with your evening meal.  DO NOT stop taking Xarelto without talking to the health care provider who prescribed the medication.  Refill your prescription for 20 mg tablets before you run out.  After discharge, you should have regular check-up appointments with your healthcare provider that is prescribing your Xarelto.  In the future your dose may need to be changed if your kidney function changes by a significant amount.  What do you do if you miss a dose? If you are taking Xarelto TWICE DAILY and you miss a dose, take it as soon as you remember. You may take two 15 mg tablets (total 30 mg) at the same time then resume your regularly scheduled 15 mg twice daily the next day.  If you are taking Xarelto ONCE DAILY and you miss a dose, take it as soon as you remember on the same day then continue your regularly scheduled once daily regimen the next day. Do not take two doses of Xarelto at the same time.   Important Safety Information Xarelto is a blood thinner medicine that can cause bleeding. You should call your healthcare provider right away if you experience any of the following: ? Bleeding from an injury or your nose that does not stop. ? Unusual colored urine (red or dark brown) or unusual colored  stools (red or black). ? Unusual bruising for unknown reasons. ? A serious fall or if you hit your head (even if there is no bleeding).  Some medicines may interact with Xarelto and might increase your risk of bleeding while on Xarelto. To help avoid this, consult your healthcare provider or pharmacist prior to using any new prescription or non-prescription medications, including herbals, vitamins, non-steroidal anti-inflammatory drugs (NSAIDs) and supplements.  This website has more information on Xarelto: [email or web address].xarelto.[email or web address].

## 2015-02-10 NOTE — Progress Notes (Signed)
VASCULAR LAB PRELIMINARY  PRELIMINARY  PRELIMINARY  PRELIMINARY  Bilateral lower extremity venous duplex  completed.    Preliminary report:  Right:  No evidence of DVT, superficial thrombosis, or Baker's cyst.   Left: DVT noted in the CFV. FV. Popliteal vein, and gastrocnemius veins.  Non occlusive in the popliteal vein.  Unable to adequately interrogate the calf due to swelling, infection, pain.  GSV thrombosed at the junction with the CFV but patent past the junction.  No Baker's cyst.   Quirino Kakos, RVT 02/10/2015, 9:12 AM

## 2015-02-10 NOTE — Progress Notes (Signed)
Patient ID: Larry SjogrenSteve Cornia, male   DOB: 04-07-1956, 59 y.o.   MRN: [id number] No acute changes.  VAC on left ankle with good seal.  Ankle swelling to be expected.  The plan is [facility] thru the weekend.

## 2015-02-10 NOTE — Care Management Note (Signed)
Case Management Note  Patient Details  Name: Larry Hancock MRN: [id number] Date of Birth: July 06, 1956  Subjective/Objective:     59 yr old male admitted s/p left tibia pilon fracture for removal of fixator and to have ORIF.             Action/Plan:   Patient will follow up with MD concerning outpatient therapy   Expected Discharge Date:                  Expected Discharge Plan:  Home/Self Care  In-House Referral:     Discharge planning Services  CM Consult  Post Acute Care Choice:  NA Choice offered to:     DME Arranged:    DME Agency:     HH Arranged:    HH Agency:     Status of Service:  In process, will continue to follow  Medicare Important Message Given:    Date Medicare IM Given:    Medicare IM give by:    Date Additional Medicare IM Given:    Additional Medicare Important Message give by:     If discussed at Long Length of Stay Meetings, dates discussed:    Additional Comments:  Patient is under worker's compensation, handled by [employer].  Nurse case Manager is Marrianne MoodKaren Watts [phone] works with [employer]. Patient was provided all DME during previous hospitalization.    Durenda GuthrieBrady, Retha Bither Naomi, RN 02/10/2015, 11:21 AM

## 2015-02-11 LAB — VANCOMYCIN, TROUGH: Vancomycin Tr: 11 ug/mL (ref 10.0–20.0)

## 2015-02-11 NOTE — Progress Notes (Signed)
Patient ID: Larry SjogrenSteve Hancock, male   DOB: [birth year], 59 y.o.   MRN: [id number] No acute changes.  Does seem to have much less swelling today and he feels that his left ankle and foot is less swollen as well.  The VAC has a good seal.

## 2015-02-11 NOTE — Progress Notes (Signed)
ANTIBIOTIC CONSULT NOTE - FOLLOW UP  Pharmacy Consult for vancomycin Indication: ankle wound infection  No Known Allergies  Patient Measurements: Weight: 195 lb (88.451 kg)   Temp: 99.4 F (37.4 C) (05/14 1300) BP: 140/78 mmHg (05/14 1300) Pulse Rate: 85 (05/14 1300) Intake/Output from previous day: 05/13 0701 - 05/14 0700 In: 840 [P.O.:840] Out: 500 [Urine:500] Intake/Output from this shift:    Labs: No results for input(s): WBC, HGB, PLT, LABCREA, CREATININE in the last 72 hours. Estimated Creatinine Clearance: 86.7 mL/min (by C-G formula based on Cr of 1.01).  Recent Labs  02/11/15 2046  [facility]     Microbiology: No results found for this or any previous visit (from the past 720 hour(s)).  Anti-infectives    Start     Dose/Rate Route Frequency Ordered Stop   02/09/15 2015  vancomycin (VANCOCIN) IVPB 1000 mg/200 mL premix     1,000 mg 200 mL/hr over 60 Minutes Intravenous Every 12 hours 02/09/15 2006     02/09/15 2000  piperacillin-tazobactam (ZOSYN) IVPB 3.375 g     3.375 g 12.5 mL/hr over 240 Minutes Intravenous Every 8 hours 02/09/15 1923     02/08/15 1900  ceFAZolin (ANCEF) IVPB 1 g/50 mL premix     1 g 100 mL/hr over 30 Minutes Intravenous Every 6 hours 02/08/15 1743 02/09/15 0730   02/08/15 1130  ceFAZolin (ANCEF) IVPB 2 g/50 mL premix     2 g 100 mL/hr over 30 Minutes Intravenous To Surgery 02/07/15 1333 02/08/15 1300      Assessment: 59 y.o. male s/p surgery for ankle crush injury on Vancomycin for ankle wound infection. A vancomycin trough was at goal (vancomycin trough= 11) on vancomycin 1000mg  IV q12h.   Goal of Therapy:  Vancomycin trough level 10-15 mcg/ml  Plan: -No vancomycin changes needed  -Will follow patient progress and plans for  length of therapy  Harland Germanndrew Kenniyah Sasaki, Pharm D 02/11/2015 10:13 PM

## 2015-02-11 NOTE — Progress Notes (Signed)
PT Cancellation Note  Patient Details Name: Larry Hancock MRN: [id number] DOB: [birth year]   Cancelled Treatment:    Reason Eval/Treat Not Completed: Medical issues which prohibited therapy. Per RN, pt still on strict bedrest.   Ilda FoilGarrow, Daunte Oestreich Rene 02/11/2015, 3:06 PM

## 2015-02-12 LAB — BASIC METABOLIC PANEL
Anion gap: 12 (ref 5–15)
BUN: 19 mg/dL (ref 6–20)
CO2: 22 mmol/L (ref 22–32)
CREATININE: 1.12 mg/dL (ref 0.61–1.24)
Calcium: 8.6 mg/dL — ABNORMAL LOW (ref 8.9–10.3)
Chloride: 101 mmol/L (ref 101–111)
GFR calc Af Amer: >60 mL/min (ref >60)
GFR calc non Af Amer: >60 mL/min (ref >60)
GLUCOSE: 94 mg/dL (ref 65–99)
POTASSIUM: 4.2 mmol/L (ref 3.5–5.1)
SODIUM: 135 mmol/L (ref 135–145)

## 2015-02-12 MED ORDER — POLYETHYLENE GLYCOL 3350 17 G PO PACK
17.0000 g | PACK | Freq: Every day | ORAL | Status: DC | PRN
  Administered 2015-02-12: 17 g via ORAL

## 2015-02-12 MED ORDER — DOCUSATE SODIUM 100 MG PO CAPS
100.0000 mg | ORAL_CAPSULE | Freq: Two times a day (BID) | ORAL | Status: DC
  Administered 2015-02-12 – 2015-02-13 (×2): 100 mg via ORAL
  Filled 2015-02-12: qty 1

## 2015-02-12 NOTE — Progress Notes (Signed)
Patient reports no complaints this AM. VAC with good seal. Left Calf supple and nontender. Able to wiggle great left toe, DP intact. Vac to be left intact , Dr. Lajoyce Cornersuda to resume care tomorrow.

## 2015-02-13 ENCOUNTER — Encounter (HOSPITAL_COMMUNITY): Payer: Self-pay | Admitting: Orthopedic Surgery

## 2015-02-13 MED ORDER — OXYCODONE-ACETAMINOPHEN 5-325 MG PO TABS: 1.0000 | ORAL_TABLET | ORAL | Status: DC | PRN

## 2015-02-13 MED ORDER — RIVAROXABAN 20 MG PO TABS: 20.0000 mg | ORAL_TABLET | Freq: Every day | ORAL | Status: DC

## 2015-02-13 MED ORDER — FLEET ENEMA 7-19 GM/118ML RE ENEM
1.0000 | ENEMA | Freq: Every day | RECTAL | Status: DC | PRN
  Administered 2015-02-13: 1 via RECTAL
  Filled 2015-02-13: qty 1

## 2015-02-13 MED ORDER — RIVAROXABAN 15 MG PO TABS: 15.0000 mg | ORAL_TABLET | Freq: Two times a day (BID) | ORAL | Status: DC

## 2015-02-13 NOTE — Progress Notes (Signed)
Physical Therapy Treatment Patient Details Name: Larry SjogrenSteve Hancock MRN: [id number] DOB: [birth year] Today's Date: 02/13/2015    History of Present Illness Pt is a 59 y/o M s/p L tib fib grade I pilon fx now w/ ORIF. Pt's PMH includes smoking and pt + for LLE DVT on 02/10/15.    PT Comments    Patient is making good progress with PT.  From a mobility standpoint anticipate patient will be ready for DC home today pending medical clearance.  Pt demonstrated ability to ambulate 45 ft and ascend/descend 1 step this session. Pt w/ residual swelling in LLE w/ negative Homan's sign and pt reports "pressure" only upon palpation of L calf.   Follow Up Recommendations  Outpatient PT;Supervision - Intermittent     Equipment Recommendations  Other (comment) (knee walker )    Recommendations for Other Services       Precautions / Restrictions Precautions Precautions: Fall Precaution Comments: Pt w/ + DVT in LLE on 02/10/15; RN confirmed pt has been lifted from bed rest prior to session. Restrictions Weight Bearing Restrictions: Yes LLE Weight Bearing: Non weight bearing    Mobility  Bed Mobility Overal bed mobility: Modified Independent Bed Mobility: Supine to Sit     Supine to sit: Modified independent (Device/Increase time)     General bed mobility comments: Increased time and mod use of bed rails for supine>sit  Transfers Overall transfer level: Needs assistance Equipment used: Rolling walker (2 wheeled) Transfers: Sit to/from [employer]Stand;Stand Pivot Transfers Sit to Stand: Supervision Stand pivot transfers: Min guard       General transfer comment: Supervision for safety during sit<>stand.  Min guard during stand pivot to knee walker and back to RW.  Ambulation/Gait Ambulation/Gait assistance: Min guard Ambulation Distance (Feet): 45 Feet Assistive device: Rolling walker (2 wheeled) Gait Pattern/deviations: Antalgic;Trunk flexed   Gait velocity interpretation: Below normal speed for  age/gender General Gait Details: hop on RLE, pt required 3 standin rest breaks 2/2 fatigue of BUEs and RLE   Stairs Stairs: Yes Stairs assistance: Min guard Stair Management: No rails;Backwards;With walker Number of Stairs: 1 General stair comments: Min verbal cues, pt demonstrated good carryover from previous hospital stay ~1.5 weeks ago.  Wheelchair Mobility    Modified Rankin (Stroke Patients Only)       Balance Overall balance assessment: Needs assistance Sitting-balance support: Bilateral upper extremity supported;Feet supported (RLE supported) Sitting balance-Leahy Scale: Fair     Standing balance support: Bilateral upper extremity supported;During functional activity Standing balance-Leahy Scale: Fair                      Cognition Arousal/Alertness: Awake/alert Behavior During Therapy: WFL for tasks assessed/performed Overall Cognitive Status: Within Functional Limits for tasks assessed                      Exercises Total Joint Exercises Ankle Circles/Pumps: AROM;Right;10 reps;Supine Heel Slides: AROM;Left;10 reps;Supine Straight Leg Raises: AROM;Right;10 reps;Supine Long Arc Quad: AROM;Both;10 reps;Seated    General Comments General comments (skin integrity, edema, etc.): 2/2 pt's report of discomfort and "pressure" in L knee region pt did not attempt use of knee walker this session.  Discussed w/ pt that if he decides to use knee walker he should go to OPPT for education on safe use.  Pt will only be using a knee walker if his insurance will cover it, CM notified of pt's desires and CM following up w/ insurance to determine if knee walker will be  covered.  Pt educated on use of brakes on knee walker and safe stand pivot transfer to knee walker from RW.      Pertinent Vitals/Pain Pain Assessment: 0-10 Pain Score: 4  Pain Location: LLE Pain Descriptors / Indicators: Aching;Pressure Pain Intervention(s): Limited activity within patient's  tolerance;Monitored during session;Repositioned;Patient requesting pain meds-RN notified    Home Living                      Prior Function            PT Goals (current goals can now be found in the care plan section) Acute Rehab PT Goals Patient Stated Goal: to go home today Progress towards PT goals: Progressing toward goals    Frequency  Min 5X/week    PT Plan Current plan remains appropriate    Co-evaluation             End of Session Equipment Utilized During Treatment: Gait belt Activity Tolerance: Patient limited by pain;Patient limited by fatigue Patient left: in chair;with call bell/phone within reach;with nursing/sitter in room     Time: 0921-0942 PT Time Calculation (min) (ACUTE ONLY): 21 min  Charges:  $Gait Training: 8-22 mins                    G Codes:      Michail JewelsAshley Parr PT, [place]DPT [phone] Pager: [phone] 02/13/2015, 9:59 AM

## 2015-02-13 NOTE — Care Management Note (Signed)
Case Management Note  Patient Details  Name: Larry Hancock MRN: [id number] Date of Birth: [birth year]  Subjective/Objective:  patient admitted for removal of fixator from Left ankle tibia pilon fracture. Patient underwent a Left ankle ORIF.   Action/Plan:  No HH needs.  CM will contact worker's comp regarding a knee walker.   Expected Discharge Date: 02/13/15                 Expected Discharge Plan:  Home/Self Care  In-House Referral:     Discharge planning Services  CM Consult  Post Acute Care Choice:  NA Choice offered to:     DME Arranged:  Other see comment DME Agency:     HH Arranged:    HH Agency:     Status of Service:  Completed, signed off  Medicare Important Message Given:    Date Medicare IM Given:    Medicare IM give by:    Date Additional Medicare IM Given:    Additional Medicare Important Message give by:     If discussed at Long Length of Stay Meetings, dates discussed:    Additional Comments:Case manager faxed order for knee walker to [employer] [phone] . Phone [phone]. Knee walker will be delivered to patient's home.  Durenda GuthrieBrady, Maebel Marasco Naomi, RN 02/13/2015, 1:21 PM

## 2015-02-13 NOTE — Progress Notes (Signed)
Discharge instruction gave to pt and all questions answered. Pt is ready for discharge.

## 2015-02-13 NOTE — Discharge Summary (Signed)
Physician Discharge Summary  Patient ID: Larry Hancock MRN: [id number] DOB/AGE: 1956-03-28 59 y.o.  Admit date: 02/08/2015 Discharge date: 02/13/2015  Admission Diagnoses: Open pilon fracture left ankle with crush injury  Discharge Diagnoses: DVT left lower extremity Active Problems:   Pilon fracture of left tibia   Discharged Condition: stable  Hospital Course: Patient's hospital course was essentially unremarkable. Patient underwent open reduction internal fixation with excision of necrotic muscle. A wound VAC was placed postoperatively due to the significant soft tissue damage from the crush injury. Patient did develop increased swelling a ultrasound was positive for DVT and a CT scan was negative for a pulmonary embolus. Patient was started on Xarelto for DVT treatment. The wound VAC was removed the surgical incision had some gaping of the incision secondary to swelling but there was good beefy granulation tissue. Patient's ischemic eschar medially and laterally were stable and patient was discharged to home in stable condition with close follow-up in the office.  Consults: None  Significant Diagnostic Studies: labs: Routine labs  Treatments: surgery: See operative note  Discharge Exam: Blood pressure 157/72, pulse 79, temperature 98.3 F (36.8 C), temperature source Oral, resp. rate 16, weight 88.451 kg (195 lb), SpO2 100 %. Incision/Wound: incision with good  beefy granulation tissue, ischemic eschar medial malleolus stable.  Disposition: 01-Home or Self Care  Discharge Instructions    Call MD / Call 911    Complete by:  As directed   If you experience chest pain or shortness of breath, CALL 911 and be transported to the hospital emergency room.  If you develope a fever above 101 F, pus (white drainage) or increased drainage or redness at the wound, or calf pain, call your surgeon's office.     Constipation Prevention    Complete by:  As directed   Drink plenty of fluids.   Prune juice may be helpful.  You may use a stool softener, such as Colace (over the counter) 100 mg twice a day.  Use MiraLax (over the counter) for constipation as needed.     Diet - low sodium heart healthy    Complete by:  As directed      Increase activity slowly as tolerated    Complete by:  As directed      Non weight bearing    Complete by:  As directed   Laterality:  left  Extremity:  Lower            Medication List    TAKE these medications        ibuprofen 200 MG tablet  Commonly known as:  ADVIL,MOTRIN  Take 200 mg by mouth every 6 (six) hours as needed for headache.     oxyCODONE-acetaminophen 5-325 MG per tablet  Commonly known as:  ROXICET  Take 1 tablet by mouth every 4 (four) hours as needed for severe pain.     rivaroxaban 20 MG Tabs tablet  Commonly known as:  XARELTO  Take 1 tablet (20 mg total) by mouth daily with supper.  Start taking on:  03/03/2015           Follow-up Information    Follow up with Theoren Palka V, MD In 1 week.   Specialty:  Orthopedic Surgery   Contact information:   [address] [place] [address] [phone]       Signed: Nadara MustardDUDA,Madelein Mahadeo V 02/13/2015, 6:43 AM

## 2015-02-20 ENCOUNTER — Encounter (HOSPITAL_COMMUNITY): Payer: Self-pay | Admitting: Orthopedic Surgery

## 2015-03-02 ENCOUNTER — Encounter (HOSPITAL_COMMUNITY): Payer: Self-pay | Admitting: Orthopedic Surgery

## 2015-03-03 ENCOUNTER — Encounter (HOSPITAL_COMMUNITY): Payer: Self-pay | Admitting: Orthopedic Surgery

## 2015-09-06 IMAGING — CT CT ANGIO CHEST
2 of 9 series · 19 of 46 positions shown · IV contrast (OMNI)
Comparison: None.

CLINICAL DATA: Diaphoresis, dizziness, nausea, left lower extremity
pain. Left lower extremity swelling.

EXAM:
CT ANGIOGRAPHY CHEST WITH CONTRAST
TECHNIQUE: Multidetector CT imaging of the chest was performed using the
standard protocol during bolus administration of intravenous
contrast. Multiplanar CT image reconstructions and MIPs were
obtained to evaluate the vascular anatomy.
CONTRAST:  75mL OMNIPAQUE IOHEXOL 350 MG/ML SOLN

[Series 5: thins · axial · 0.73mm/px · z∈[+91,+357]mm · 16 of 300 slices shown]
[im 17/300  lung]
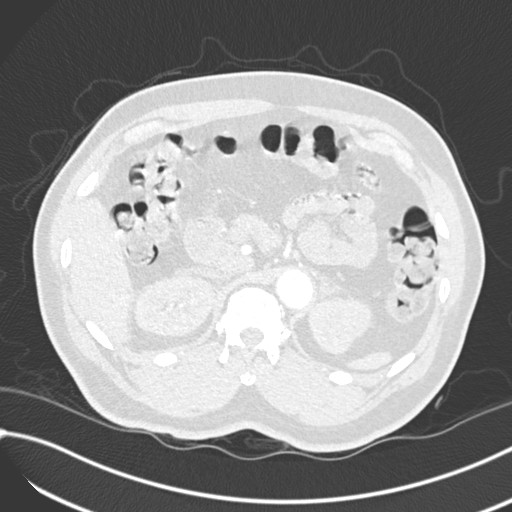
[im 34/300  soft-tissue]
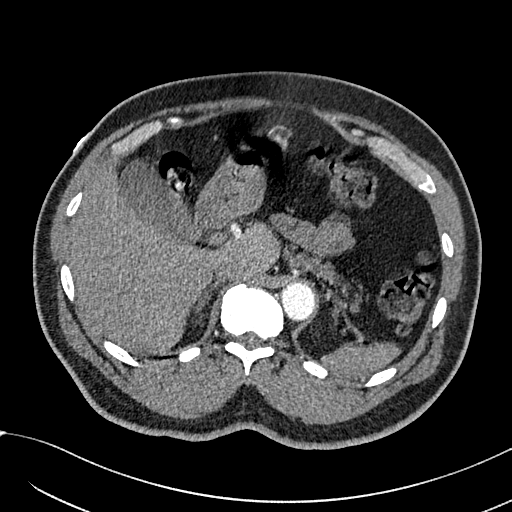
[im 50/300  lung]
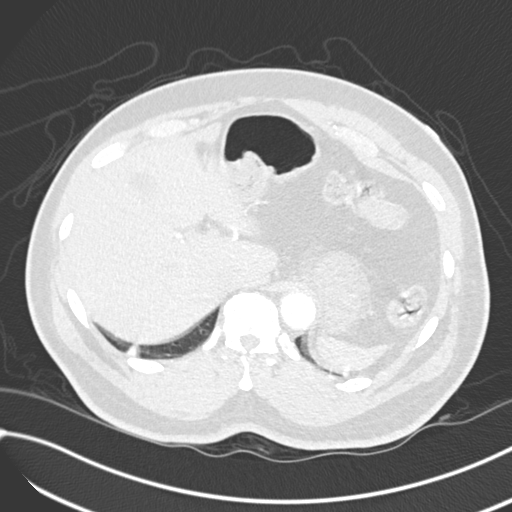
[im 67/300  soft-tissue]
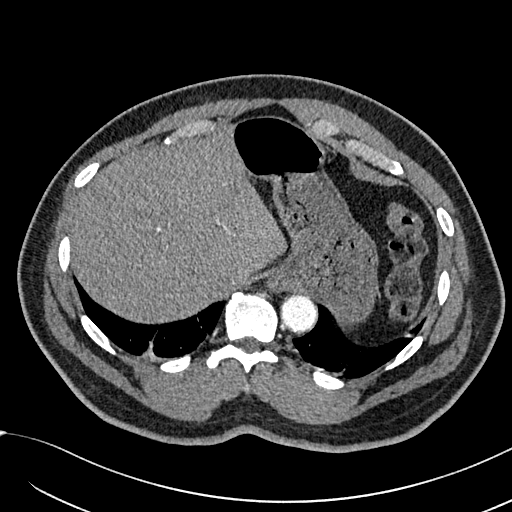
[im 84/300  lung]
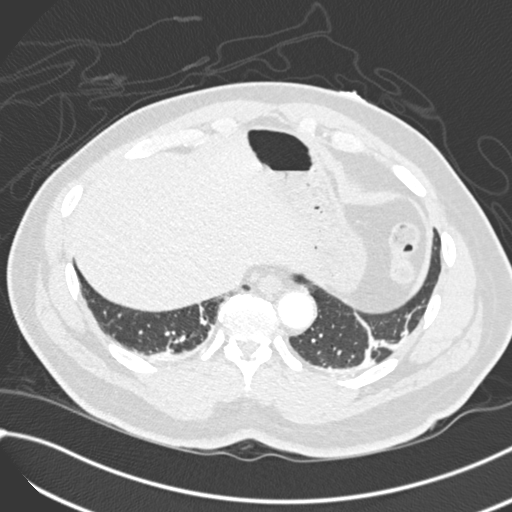
[im 100/300  soft-tissue]
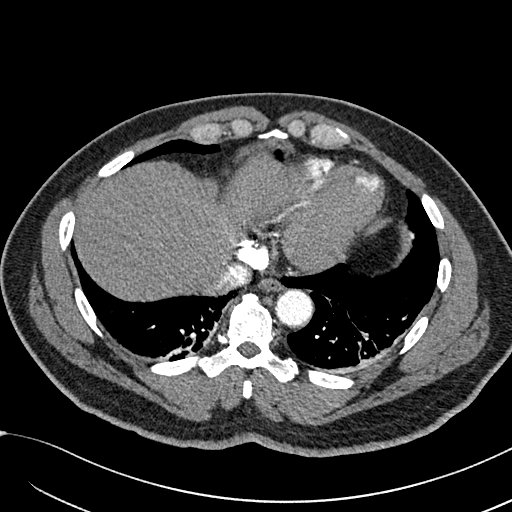
[im 117/300  lung]
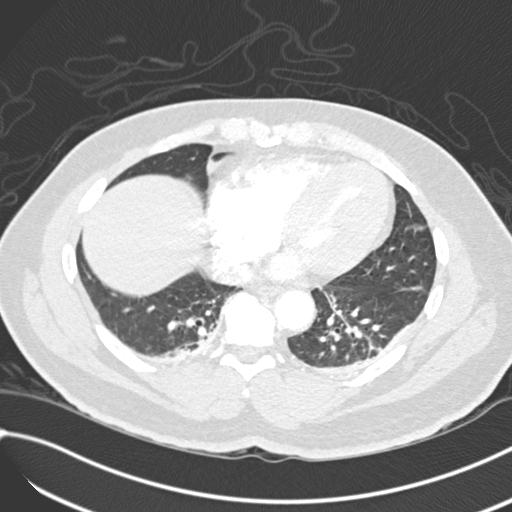
[im 133/300  soft-tissue]
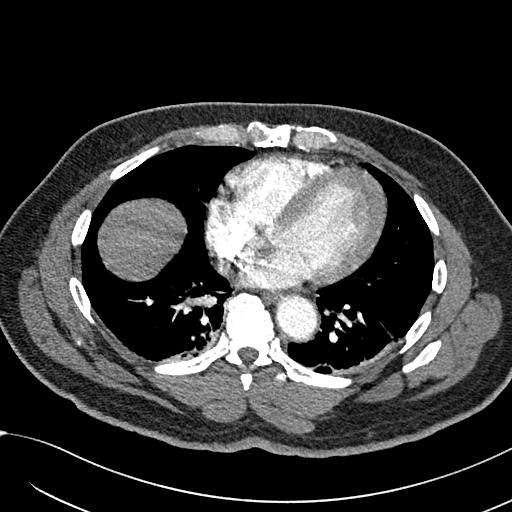
[im 167/300  lung]
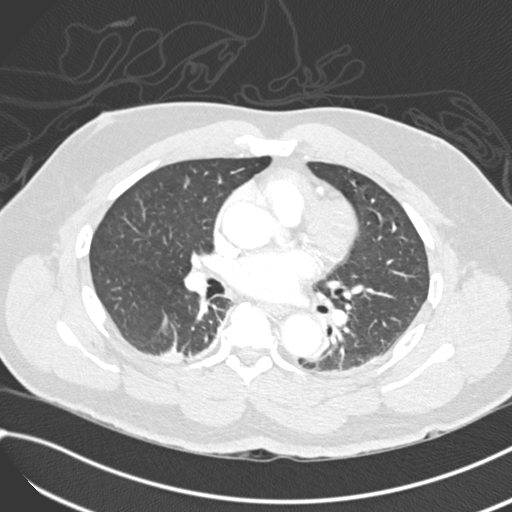
[im 183/300  soft-tissue]
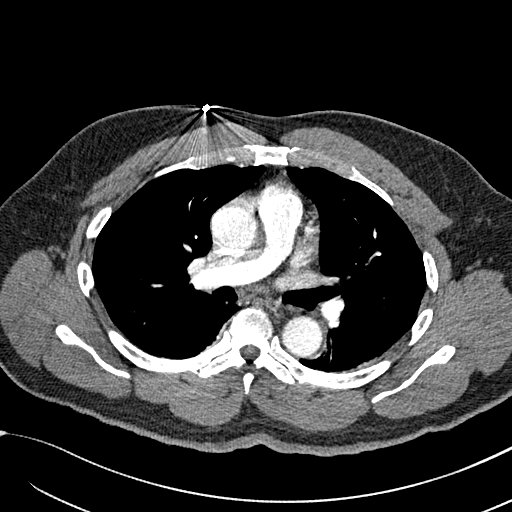
[im 200/300  lung]
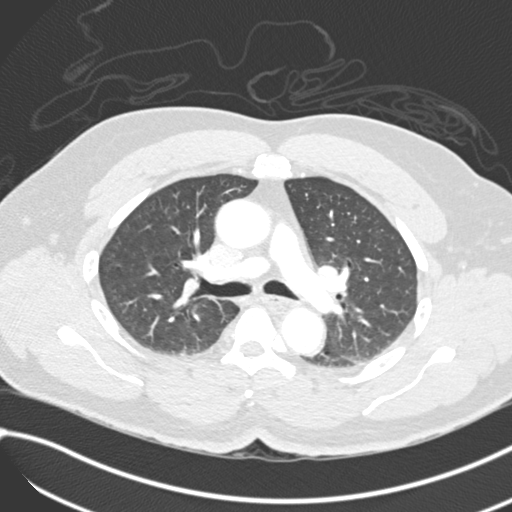
[im 216/300  soft-tissue]
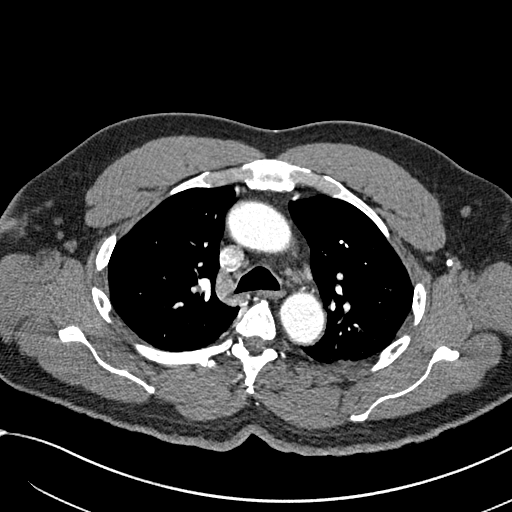
[im 233/300  lung]
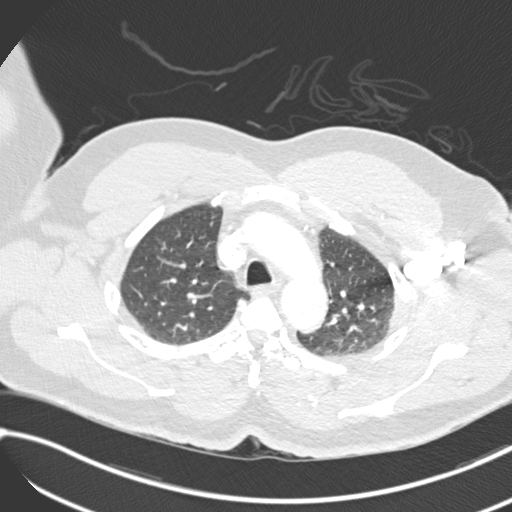
[im 250/300  soft-tissue]
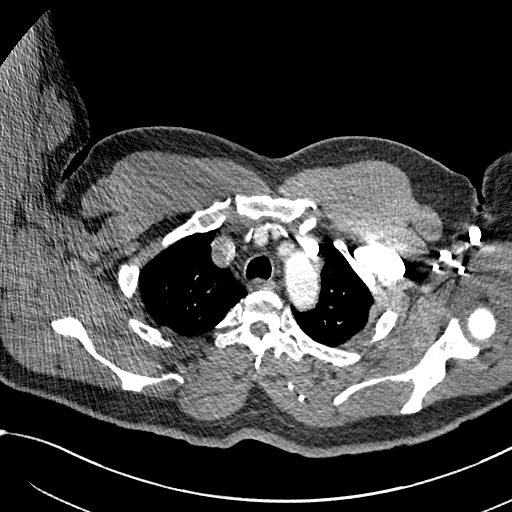
[im 266/300  lung]
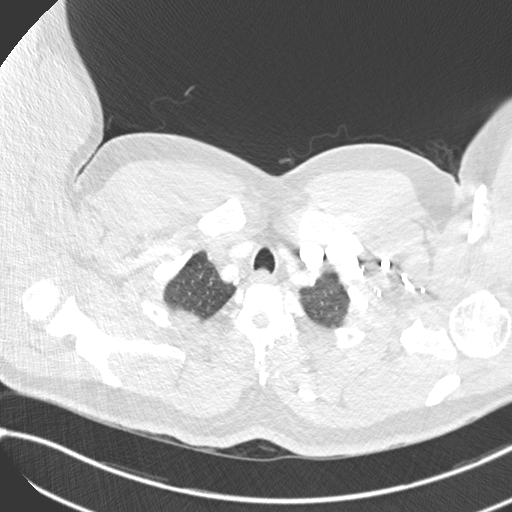
[im 283/300  soft-tissue]
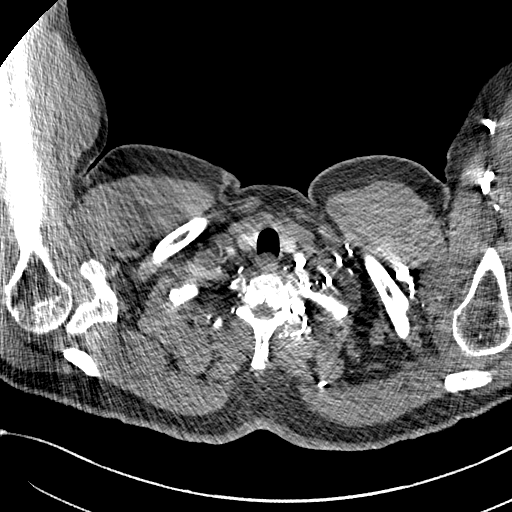

[Series 7: coronal mpr · coronal · 0.65mm/px · 3 of 127 slices shown]
[im 32/127  soft-tissue]
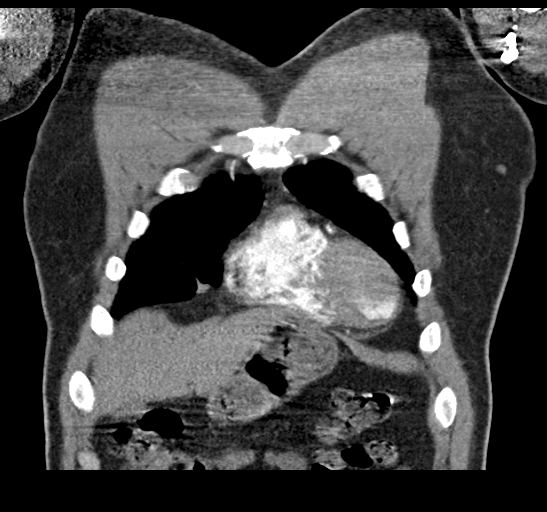
[im 64/127  soft-tissue]
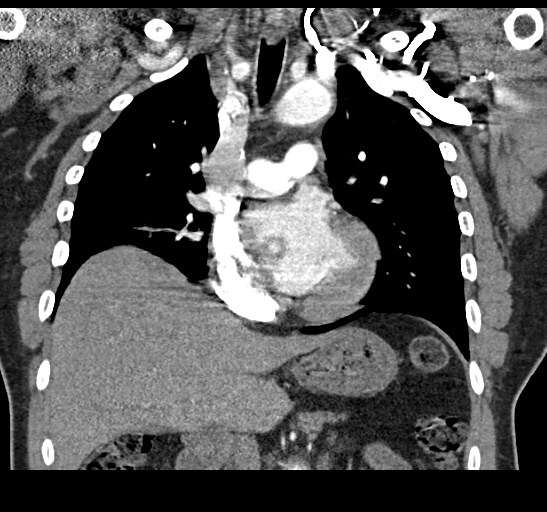
[im 95/127  soft-tissue]
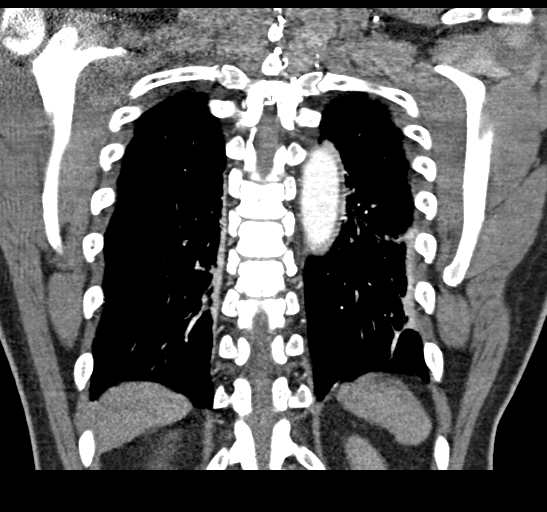

[19 of 46 positions shown; findings below may reference images not displayed]

FINDINGS: Technically adequate study with good opacification of the central
and segmental pulmonary arteries. No focal filling defects. No
evidence of significant pulmonary embolus.

Normal heart size. Normal caliber thoracic aorta. No aortic
dissection. Great vessel origins are patent. Calcification in the
Coronary arteries. Esophagus is decompressed. No significant
lymphadenopathy in the chest.

Evaluation of lungs is limited due to respiratory motion artifact.
There is evidence of atelectasis or scarring in both lung bases. No
focal consolidation. Airways appear patent. No pleural effusions. No
pneumothorax.

Included portions of the upper abdominal organs are grossly
unremarkable. Degenerative changes in the spine. Probable old
fracture deformity of the sternum.

Review of the MIP images confirms the above findings.
IMPRESSION: No evidence of significant pulmonary embolus. Linear atelectasis or
scarring in the lung bases. No focal consolidation.

## 2015-09-30 ENCOUNTER — Emergency Department (HOSPITAL_COMMUNITY)
Admission: EM | Admit: 2015-09-30 | Discharge: 2015-09-30 | Disposition: A | Discharge status: Home / Self Care | Payer: Managed Care, Other (non HMO) | Attending: Emergency Medicine | Admitting: Emergency Medicine

## 2015-09-30 ENCOUNTER — Encounter (HOSPITAL_COMMUNITY): Payer: Self-pay | Admitting: Neurology

## 2015-09-30 DIAGNOSIS — F17210 Nicotine dependence, cigarettes, uncomplicated: Secondary | ICD-10-CM | POA: Insufficient documentation

## 2015-09-30 DIAGNOSIS — J020 Streptococcal pharyngitis: Secondary | ICD-10-CM | POA: Diagnosis not present

## 2015-09-30 DIAGNOSIS — R55 Syncope and collapse: Secondary | ICD-10-CM | POA: Insufficient documentation

## 2015-09-30 DIAGNOSIS — Z8781 Personal history of (healed) traumatic fracture: Secondary | ICD-10-CM | POA: Insufficient documentation

## 2015-09-30 DIAGNOSIS — Z79899 Other long term (current) drug therapy: Secondary | ICD-10-CM | POA: Insufficient documentation

## 2015-09-30 LAB — CBC
HCT: 43.5 % (ref 39.0–52.0)
Hemoglobin: 14.2 g/dL (ref 13.0–17.0)
MCH: 29.5 pg (ref 26.0–34.0)
MCHC: 32.6 g/dL (ref 30.0–36.0)
MCV: 90.2 fL (ref 78.0–100.0)
Platelets: 217 10*3/uL (ref 150–400)
RBC: 4.82 MIL/uL (ref 4.22–5.81)
RDW: 15.6 % — ABNORMAL HIGH (ref 11.5–15.5)
WBC: 11.1 10*3/uL — ABNORMAL HIGH (ref 4.0–10.5)

## 2015-09-30 LAB — CBG MONITORING, ED: Glucose-Capillary: 96 mg/dL (ref 65–99)

## 2015-09-30 LAB — URINALYSIS, ROUTINE W REFLEX MICROSCOPIC
Glucose, UA: NEGATIVE mg/dL
Ketones, ur: NEGATIVE mg/dL
Leukocytes, UA: NEGATIVE
Nitrite: NEGATIVE
Protein, ur: 100 mg/dL — AB
Specific Gravity, Urine: 1.035 — ABNORMAL HIGH (ref 1.005–1.030)
pH: 6 (ref 5.0–8.0)

## 2015-09-30 LAB — I-STAT TROPONIN, ED: Troponin i, poc: 0.01 ng/mL (ref 0.00–0.08)

## 2015-09-30 LAB — BASIC METABOLIC PANEL
Anion gap: 9 (ref 5–15)
BUN: 12 mg/dL (ref 6–20)
CO2: 29 mmol/L (ref 22–32)
Calcium: 8.9 mg/dL (ref 8.9–10.3)
Chloride: 102 mmol/L (ref 101–111)
Creatinine, Ser: 1.29 mg/dL — ABNORMAL HIGH (ref 0.61–1.24)
GFR calc Af Amer: >60 mL/min (ref >60)
GFR calc non Af Amer: 59 mL/min — ABNORMAL LOW (ref >60)
Glucose, Bld: 109 mg/dL — ABNORMAL HIGH (ref 65–99)
Potassium: 3.5 mmol/L (ref 3.5–5.1)
Sodium: 140 mmol/L (ref 135–145)

## 2015-09-30 LAB — URINE MICROSCOPIC-ADD ON
Bacteria, UA: NONE SEEN
Squamous Epithelial / LPF: NONE SEEN

## 2015-09-30 MED ORDER — KETOROLAC TROMETHAMINE 15 MG/ML IJ SOLN
15.0000 mg | Freq: Once | INTRAMUSCULAR | Status: AC
  Administered 2015-09-30: 15 mg via INTRAVENOUS
  Filled 2015-09-30: qty 1

## 2015-09-30 MED ORDER — PENICILLIN G BENZATHINE 1200000 UNIT/2ML IM SUSP
1.2000 10*6.[IU] | Freq: Once | INTRAMUSCULAR | Status: AC
  Administered 2015-09-30: 1.2 10*6.[IU] via INTRAMUSCULAR
  Filled 2015-09-30: qty 2

## 2015-09-30 MED ORDER — HYDROCODONE-ACETAMINOPHEN 7.5-325 MG/15ML PO SOLN: 10.0000 mL | Freq: Four times a day (QID) | ORAL | Status: DC | PRN

## 2015-09-30 MED ORDER — SODIUM CHLORIDE 0.9 % IV BOLUS (SEPSIS)
1000.0000 mL | Freq: Once | INTRAVENOUS | Status: AC
  Administered 2015-09-30: 1000 mL via INTRAVENOUS

## 2015-09-30 NOTE — ED Notes (Signed)
Per ems- pt was working at [employer] when he woke up on the floor. Does not remember falling or feeling bad before waking up on the floor. Unwitnessed episode, estimating LOC for 2 mins. Denies pain at current. Is a x 4. Has small injury to tip of tongue as well as swollen tonsils with white patches. Has been feeling achy, and weak and he thought he had bad case of the flu. Denies cp or sob at current. BP 178/108, HR 80 SR. 98% RA. CBG 110.

## 2015-09-30 NOTE — ED Notes (Signed)
Pt's left side of face appears swollen at d/c. Dr. Juleen ChinaKohut to bedside to examine. No difficulties breathing. 100 % RA. Advised to follow up with PCP regarding BP.

## 2015-09-30 NOTE — Discharge Instructions (Signed)

## 2015-10-13 NOTE — ED Provider Notes (Signed)
CSN: [id number]     Arrival date & time 09/30/15  1146 History   First MD Initiated Contact with Patient 09/30/15 1233     Chief Complaint  Patient presents with  . Loss of Consciousness     (Consider location/radiation/quality/duration/timing/severity/associated sxs/prior Treatment) HPI   [age]-year-old male with syncopal event. Happened just before arrival while at work. 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 Was feeling somewhat lightheaded and then waking up on the floor. Denies any preceding chest pain, shortness of breath or palpitations. On review of systems, he does endorse a sore throat for the past couple days. Intermittent subjective fever and has generally felt achy/week. He thought he may have the flu. He has not been eating or drinking as much as he normally does secondary to throat pain. No cough. No respiratory complaints. No unusual leg pain or swelling.  Past Medical History  Diagnosis Date  . Pilon fracture     left  . Headache    Past Surgical History  Procedure Laterality Date  . External fixation leg Left 01/31/2015    Procedure: EXTERNAL FIXATION LEG;  Surgeon: Nadara MustardMarcus Duda V, MD;  Location: [facility] OR;  Service: Orthopedics;  Laterality: Left;  Marland Kitchen. Eye surgery Left     childhood accident   / blind in left eye  . Wisdom tooth extraction    . External fixation removal Left 02/08/2015    Procedure: REMOVAL EXTERNAL FIXATION LEG;  Surgeon: Nadara MustardMarcus Duda V, MD;  Location: [facility] OR;  Service: Orthopedics;  Laterality: Left;  . Open reduction internal fixation (orif) tibia/fibula fracture Left 02/08/2015    Procedure: OPEN REDUCTION INTERNAL FIXATION (ORIF) TIBIA/FIBULA ;  Surgeon: Nadara MustardMarcus Duda V, MD;  Location: [facility] OR;  Service: Orthopedics;  Laterality: Left;   No family history on file. Social History  Substance Use Topics  . Smoking status: Light Tobacco Smoker -- 0.00 packs/day    Types: Cigarettes  . Smokeless tobacco: Never Used  . Alcohol Use: No     Review of Systems  All systems reviewed and negative, other than as noted in HPI.   Allergies  Review of patient's allergies indicates no known allergies.  Home Medications   Prior to Admission medications   Medication Sig Start Date End Date Taking? Authorizing Provider  diphenhydrAMINE (SOMINEX) 25 MG tablet Take 25 mg by mouth at bedtime as needed for sleep.   Yes Historical Provider, MD  ibuprofen (ADVIL,MOTRIN) 200 MG tablet Take 200 mg by mouth every 6 (six) hours as needed for headache.   Yes Historical Provider, MD  HYDROcodone-acetaminophen (HYCET) 7.5-325 mg/15 ml solution Take 10 mLs by mouth 4 (four) times daily as needed for moderate pain. 09/30/15   Raeford RazorStephen Adisson Deak, MD  oxyCODONE-acetaminophen (ROXICET) 5-325 MG per tablet Take 1 tablet by mouth every 4 (four) hours as needed for severe pain. 02/13/15   Nadara MustardMarcus Duda V, MD  Rivaroxaban (XARELTO) 15 MG TABS tablet Take 1 tablet (15 mg total) by mouth 2 (two) times daily with a meal. 02/13/15   Nadara MustardMarcus Duda V, MD  rivaroxaban (XARELTO) 20 MG TABS tablet Take 1 tablet (20 mg total) by mouth daily with supper. 03/03/15   Nadara MustardMarcus Duda V, MD   BP 187/118 mmHg  Pulse 81  Temp(Src) 98.5 F (36.9 C) (Oral)  Resp 14  Ht 5\' 9"  (1.753 m)  Wt 190 lb (86.183 kg)  BMI 28.05 kg/m2  SpO2 100% Physical Exam  Constitutional: He appears well-developed and well-nourished. No distress.  HENT:  Head: Normocephalic and atraumatic.  Mouth/Throat: Oropharyngeal exudate present.  Exudative pharyngitis. Uvula is midline. Normal sounding voice. Handling secretions. Neck supple. Tender left cervical adenopathy. There is some mild fullness underneath the body of the left mandible. The tissues are soft though. Tongue is not elevated. No stridor.  Eyes: Conjunctivae are normal. Right eye exhibits no discharge. Left eye exhibits no discharge.  Neck: Neck supple.  Cardiovascular: Normal rate, regular rhythm and normal heart sounds.  Exam reveals no gallop  and no friction rub.   No murmur heard. Pulmonary/Chest: Effort normal and breath sounds normal. No respiratory distress.  Abdominal: Soft. He exhibits no distension. There is no tenderness.  Musculoskeletal: He exhibits no edema or tenderness.  Neurological: He is alert.  Skin: Skin is warm and dry.  Psychiatric: He has a normal mood and affect. His behavior is normal. Thought content normal.  Nursing note and vitals reviewed.   ED Course  Procedures (including critical care time) Labs Review Labs Reviewed  BASIC METABOLIC PANEL - Abnormal; Notable for the following:    Glucose, Bld 109 (*)    Creatinine, Ser 1.29 (*)    GFR calc non Af Amer 59 (*)    All other components within normal limits  CBC - Abnormal; Notable for the following:    WBC 11.1 (*)    RDW 15.6 (*)    All other components within normal limits  URINALYSIS, ROUTINE W REFLEX MICROSCOPIC (NOT AT [facility]) - Abnormal; Notable for the following:    Color, Urine AMBER (*)    Specific Gravity, Urine 1.035 (*)    Hgb urine dipstick SMALL (*)    Bilirubin Urine SMALL (*)    Protein, ur 100 (*)    All other components within normal limits  URINE MICROSCOPIC-ADD ON - Abnormal; Notable for the following:    Casts HYALINE CASTS (*)    All other components within normal limits  CBG MONITORING, ED  I-STAT TROPOININ, ED    Imaging Review No results found. I have personally reviewed and evaluated these images and lab results as part of my medical decision-making.   EKG Interpretation   Date/Time:  Saturday September 30 2015 11:46:38 EST Ventricular Rate:  74 PR Interval:  140 QRS Duration: 93 QT Interval:  393 QTC Calculation: 436 R Axis:   16 Text Interpretation:  Sinus rhythm Abnormal T, consider ischemia, lateral  leads ED PHYSICIAN INTERPRETATION AVAILABLE IN [facility] Confirmed  by TEST, Record ([id number]) on 10/01/2015 9:27:50 AM      MDM   Final diagnoses:  Strep pharyngitis  Syncope and collapse     [age]-year-old male with exudative pharyngitis which I suspect may be strep although patient is somewhat older than typical population. Poor by mouth intake recently secondary to a sore throat. Suspect he may be somewhat dehydrated contributing to his syncopal event earlier today. Low suspicion for alternative emergent etiology. Treated with Bicillin. Feels better after IV fluids. I feel is appropriate for discharge at this time. Return precautions were discussed. Advised that needs to follow-up for recheck of his blood pressure.    Raeford Razor, MD 10/13/15 [phone]

## 2016-12-23 ENCOUNTER — Other Ambulatory Visit (INDEPENDENT_AMBULATORY_CARE_PROVIDER_SITE_OTHER): Payer: Managed Care, Other (non HMO)

## 2016-12-23 ENCOUNTER — Ambulatory Visit (INDEPENDENT_AMBULATORY_CARE_PROVIDER_SITE_OTHER): Payer: Managed Care, Other (non HMO) | Admitting: Family

## 2016-12-23 ENCOUNTER — Encounter: Payer: Self-pay | Admitting: Family

## 2016-12-23 VITALS — BP 182/114 | HR 86 | Temp 97.9°F | Resp 16 | Ht 69.0 in | Wt 212.0 lb

## 2016-12-23 DIAGNOSIS — I10 Essential (primary) hypertension: Secondary | ICD-10-CM | POA: Diagnosis not present

## 2016-12-23 HISTORY — DX: Essential (primary) hypertension: I10

## 2016-12-23 LAB — COMPREHENSIVE METABOLIC PANEL WITH GFR
ALT: 15 U/L (ref 0–53)
AST: 18 U/L (ref 0–37)
Albumin: 4.3 g/dL (ref 3.5–5.2)
Alkaline Phosphatase: 79 U/L (ref 39–117)
BUN: 11 mg/dL (ref 6–23)
CO2: 28 meq/L (ref 19–32)
Calcium: 9.4 mg/dL (ref 8.4–10.5)
Chloride: 102 meq/L (ref 96–112)
Creatinine, Ser: 1.02 mg/dL (ref 0.40–1.50)
GFR: 95.35 mL/min (ref >60.00)
Glucose, Bld: 114 mg/dL — ABNORMAL HIGH (ref 70–99)
Potassium: 3.6 meq/L (ref 3.5–5.1)
Sodium: 138 meq/L (ref 135–145)
Total Bilirubin: 0.4 mg/dL (ref 0.2–1.2)
Total Protein: 7.9 g/dL (ref 6.0–8.3)

## 2016-12-23 MED ORDER — LISINOPRIL-HYDROCHLOROTHIAZIDE 20-12.5 MG PO TABS: 1.0000 | ORAL_TABLET | Freq: Every day | ORAL | 1 refills | Status: DC

## 2016-12-23 NOTE — Patient Instructions (Addendum)
Thank you for choosing [facility].  SUMMARY AND INSTRUCTIONS:  Start taking the lisinopril-hydrochlorothiazide daily.  Monitor your blood pressure at home daily at different times throughout the day. Please record these numbers and bring to your next office visit.  Bring your blood pressure cuff to your next visit for correlation.  Please schedule a time for your physical at your convenience.  Work to stop smoking.  Follow up in 2 weeks for blood pressure check with nurse visit.   Medication:  Your prescription(s) have been submitted to your pharmacy or been printed and provided for you. Please take as directed and contact our office if you believe you are having problem(s) with the medication(s) or have any questions.  Labs:  Please stop by the lab on the lower level of the building for your blood work. Your results will be released to MyChart (or called to you) after review, usually within 72 hours after test completion. If any changes need to be made, you will be notified at that same time.  1.) The lab is open from 7:30am to 5:30 pm Monday-Friday 2.) No appointment is necessary 3.) Fasting (if needed) is 6-8 hours after food and drink; black coffee and water are okay   Follow up:  If your symptoms worsen or fail to improve, please contact our office for further instruction, or in case of emergency go directly to the emergency room at the closest medical facility.    Hypertension Hypertension is another name for high blood pressure. High blood pressure forces your heart to work harder to pump blood. This can cause problems over time. There are two numbers in a blood pressure reading. There is a top number (systolic) over a bottom number (diastolic). It is best to have a blood pressure below 120/80. Healthy choices can help lower your blood pressure. You may need medicine to help lower your blood pressure if:  Your blood pressure cannot be lowered with healthy  choices.  Your blood pressure is higher than 130/80. Follow these instructions at home: Eating and drinking   If directed, follow the DASH eating plan. This diet includes:  Filling half of your plate at each meal with fruits and vegetables.  Filling one quarter of your plate at each meal with whole grains. Whole grains include whole wheat pasta, brown rice, and whole grain bread.  Eating or drinking low-fat dairy products, such as skim milk or low-fat yogurt.  Filling one quarter of your plate at each meal with low-fat (lean) proteins. Low-fat proteins include fish, skinless chicken, eggs, beans, and tofu.  Avoiding fatty meat, cured and processed meat, or chicken with skin.  Avoiding premade or processed food.  Eat less than 1,500 mg of salt (sodium) a day.  Limit alcohol use to no more than 1 drink a day for nonpregnant women and 2 drinks a day for men. One drink equals 12 oz of beer, 5 oz of wine, or 1 oz of hard liquor. Lifestyle   Work with your doctor to stay at a healthy weight or to lose weight. Ask your doctor what the best weight is for you.  Get at least 30 minutes of exercise that causes your heart to beat faster (aerobic exercise) most days of the week. This may include walking, swimming, or biking.  Get at least 30 minutes of exercise that strengthens your muscles (resistance exercise) at least 3 days a week. This may include lifting weights or pilates.  Do not use any products that contain  nicotine or tobacco. This includes cigarettes and e-cigarettes. If you need help quitting, ask your doctor.  Check your blood pressure at home as told by your doctor.  Keep all follow-up visits as told by your doctor. This is important. Medicines   Take over-the-counter and prescription medicines only as told by your doctor. Follow directions carefully.  Do not skip doses of blood pressure medicine. The medicine does not work as well if you skip doses. Skipping doses also  puts you at risk for problems.  Ask your doctor about side effects or reactions to medicines that you should watch for. Contact a doctor if:  You think you are having a reaction to the medicine you are taking.  You have headaches that keep coming back (recurring).  You feel dizzy.  You have swelling in your ankles.  You have trouble with your vision. Get help right away if:  You get a very bad headache.  You start to feel confused.  You feel weak or numb.  You feel faint.  You get very bad pain in your:  Chest.  Belly (abdomen).  You throw up (vomit) more than once.  You have trouble breathing. Summary  Hypertension is another name for high blood pressure.  Making healthy choices can help lower blood pressure. If your blood pressure cannot be controlled with healthy choices, you may need to take medicine. This information is not intended to replace advice given to you by your health care provider. Make sure you discuss any questions you have with your health care provider. Document Released: 03/04/2008 Document Revised: 08/14/2016 Document Reviewed: 08/14/2016 Elsevier Interactive Patient Education  2017 Elsevier Inc.   DASH Eating Plan DASH stands for "Dietary Approaches to Stop Hypertension." The DASH eating plan is a healthy eating plan that has been shown to reduce high blood pressure (hypertension). It may also reduce your risk for type 2 diabetes, heart disease, and stroke. The DASH eating plan may also help with weight loss. What are tips for following this plan? General guidelines   Avoid eating more than 2,300 mg (milligrams) of salt (sodium) a day. If you have hypertension, you may need to reduce your sodium intake to 1,500 mg a day.  Limit alcohol intake to no more than 1 drink a day for nonpregnant women and 2 drinks a day for men. One drink equals 12 oz of beer, 5 oz of wine, or 1 oz of hard liquor.  Work with your health care provider to maintain a  healthy body weight or to lose weight. Ask what an ideal weight is for you.  Get at least 30 minutes of exercise that causes your heart to beat faster (aerobic exercise) most days of the week. Activities may include walking, swimming, or biking.  Work with your health care provider or diet and nutrition specialist (dietitian) to adjust your eating plan to your individual calorie needs. Reading food labels   Check food labels for the amount of sodium per serving. Choose foods with less than 5 percent of the Daily Value of sodium. Generally, foods with less than 300 mg of sodium per serving fit into this eating plan.  To find whole grains, look for the word "whole" as the first word in the ingredient list. Shopping   Buy products labeled as "low-sodium" or "no salt added."  Buy fresh foods. Avoid canned foods and premade or frozen meals. Cooking   Avoid adding salt when cooking. Use salt-free seasonings or herbs instead of table salt  or sea salt. Check with your health care provider or pharmacist before using salt substitutes.  Do not fry foods. Cook foods using healthy methods such as baking, boiling, grilling, and broiling instead.  Cook with heart-healthy oils, such as olive, canola, soybean, or sunflower oil. Meal planning    Eat a balanced diet that includes:  5 or more servings of fruits and vegetables each day. At each meal, try to fill half of your plate with fruits and vegetables.  Up to 6-8 servings of whole grains each day.  Less than 6 oz of lean meat, poultry, or fish each day. A 3-oz serving of meat is about the same size as a deck of cards. One egg equals 1 oz.  2 servings of low-fat dairy each day.  A serving of nuts, seeds, or beans 5 times each week.  Heart-healthy fats. Healthy fats called Omega-3 fatty acids are found in foods such as flaxseeds and coldwater fish, like sardines, salmon, and mackerel.  Limit how much you eat of the following:  Canned or  prepackaged foods.  Food that is high in trans fat, such as fried foods.  Food that is high in saturated fat, such as fatty meat.  Sweets, desserts, sugary drinks, and other foods with added sugar.  Full-fat dairy products.  Do not salt foods before eating.  Try to eat at least 2 vegetarian meals each week.  Eat more home-cooked food and less restaurant, buffet, and fast food.  When eating at a restaurant, ask that your food be prepared with less salt or no salt, if possible. What foods are recommended? The items listed may not be a complete list. Talk with your dietitian about what dietary choices are best for you. Grains  Whole-grain or whole-wheat bread. Whole-grain or whole-wheat pasta. Brown rice. Orpah Cobb. Bulgur. Whole-grain and low-sodium cereals. Pita bread. Low-fat, low-sodium crackers. Whole-wheat flour tortillas. Vegetables  Fresh or frozen vegetables (raw, steamed, roasted, or grilled). Low-sodium or reduced-sodium tomato and vegetable juice. Low-sodium or reduced-sodium tomato sauce and tomato paste. Low-sodium or reduced-sodium canned vegetables. Fruits  All fresh, dried, or frozen fruit. Canned fruit in natural juice (without added sugar). Meat and other protein foods  Skinless chicken or [place]. Ground chicken or [place]. Pork with fat trimmed off. Fish and seafood. Egg whites. Dried beans, peas, or lentils. Unsalted nuts, nut butters, and seeds. Unsalted canned beans. Lean cuts of beef with fat trimmed off. Low-sodium, lean deli meat. Dairy  Low-fat (1%) or fat-free (skim) milk. Fat-free, low-fat, or reduced-fat cheeses. Nonfat, low-sodium ricotta or cottage cheese. Low-fat or nonfat yogurt. Low-fat, low-sodium cheese. Fats and oils  Soft margarine without trans fats. Vegetable oil. Low-fat, reduced-fat, or light mayonnaise and salad dressings (reduced-sodium). Canola, safflower, olive, soybean, and sunflower oils. Avocado. Seasoning and other foods  Herbs.  Spices. Seasoning mixes without salt. Unsalted popcorn and pretzels. Fat-free sweets. What foods are not recommended? The items listed may not be a complete list. Talk with your dietitian about what dietary choices are best for you. Grains  Baked goods made with fat, such as croissants, muffins, or some breads. Dry pasta or rice meal packs. Vegetables  Creamed or fried vegetables. Vegetables in a cheese sauce. Regular canned vegetables (not low-sodium or reduced-sodium). Regular canned tomato sauce and paste (not low-sodium or reduced-sodium). Regular tomato and vegetable juice (not low-sodium or reduced-sodium). Rosita Fire. Olives. Fruits  Canned fruit in a light or heavy syrup. Fried fruit. Fruit in cream or butter sauce. Meat and other  protein foods  Fatty cuts of meat. Ribs. Fried meat. Tomasa Blase. Sausage. Bologna and other processed lunch meats. Salami. Fatback. Hotdogs. Bratwurst. Salted nuts and seeds. Canned beans with added salt. Canned or smoked fish. Whole eggs or egg yolks. Chicken or [place] with skin. Dairy  Whole or 2% milk, cream, and half-and-half. Whole or full-fat cream cheese. Whole-fat or sweetened yogurt. Full-fat cheese. Nondairy creamers. Whipped toppings. Processed cheese and cheese spreads. Fats and oils  Butter. Stick margarine. Lard. Shortening. Ghee. Bacon fat. Tropical oils, such as coconut, palm kernel, or palm oil. Seasoning and other foods  Salted popcorn and pretzels. Onion salt, garlic salt, seasoned salt, table salt, and sea salt. Worcestershire sauce. Tartar sauce. Barbecue sauce. Teriyaki sauce. Soy sauce, including reduced-sodium. Steak sauce. Canned and packaged gravies. Fish sauce. Oyster sauce. Cocktail sauce. Horseradish that you find on the shelf. Ketchup. Mustard. Meat flavorings and tenderizers. Bouillon cubes. Hot sauce and Tabasco sauce. Premade or packaged marinades. Premade or packaged taco seasonings. Relishes. Regular salad dressings. Where to find more  information:  National Heart, Lung, and Blood Institute: PopSteam.is  American Heart Association: www.heart.org Summary  The DASH eating plan is a healthy eating plan that has been shown to reduce high blood pressure (hypertension). It may also reduce your risk for type 2 diabetes, heart disease, and stroke.  With the DASH eating plan, you should limit salt (sodium) intake to 2,300 mg a day. If you have hypertension, you may need to reduce your sodium intake to 1,500 mg a day.  When on the DASH eating plan, aim to eat more fresh fruits and vegetables, whole grains, lean proteins, low-fat dairy, and heart-healthy fats.  Work with your health care provider or diet and nutrition specialist (dietitian) to adjust your eating plan to your individual calorie needs. This information is not intended to replace advice given to you by your health care provider. Make sure you discuss any questions you have with your health care provider. Document Released: 09/05/2011 Document Revised: 09/09/2016 Document Reviewed: 09/09/2016 Elsevier Interactive Patient Education  2017 Elsevier Inc.   Smoking Hazards Smoking cigarettes is extremely bad for your health. Tobacco smoke has over 200 known poisons in it. It contains the poisonous gases nitrogen oxide and carbon monoxide. There are over 60 chemicals in tobacco smoke that cause cancer. Some of the chemicals found in cigarette smoke include:   Cyanide.   Benzene.   Formaldehyde.   Methanol (wood alcohol).   Acetylene (fuel used in welding torches).   Ammonia.  Even smoking lightly shortens your life expectancy by several years. You can greatly reduce the risk of medical problems for you and your family by stopping now. Smoking is the most preventable cause of death and disease in our society. Within days of quitting smoking, your circulation improves, you decrease the risk of having a heart attack, and your lung capacity improves. There may  be some increased phlegm in the first few days after quitting, and it may take months for your lungs to clear up completely. Quitting for 10 years reduces your risk of developing lung cancer to almost that of a nonsmoker.  WHAT ARE THE RISKS OF SMOKING? Cigarette smokers have an increased risk of many serious medical problems, including:  Lung cancer.   Lung disease (such as pneumonia, bronchitis, and emphysema).   Heart attack and chest pain due to the heart not getting enough oxygen (angina).   Heart disease and peripheral blood vessel disease.   Hypertension.   Stroke.  Oral cancer (cancer of the lip, mouth, or voice box).   Bladder cancer.   Pancreatic cancer.   Cervical cancer.   Pregnancy complications, including premature birth.   Stillbirths and smaller newborn babies, birth defects, and genetic damage to sperm.   Early menopause.   Lower estrogen level for women.   Infertility.   Facial wrinkles.   Blindness.   Increased risk of broken bones (fractures).   Senile dementia.   Stomach ulcers and internal bleeding.   Delayed wound healing and increased risk of complications during surgery. Because of secondhand smoke exposure, children of smokers have an increased risk of the following:   Sudden infant death syndrome (SIDS).   Respiratory infections.   Lung cancer.   Heart disease.   Ear infections.  WHY IS SMOKING ADDICTIVE? Nicotine is the chemical agent in tobacco that is capable of causing addiction or dependence. When you smoke and inhale, nicotine is absorbed rapidly into the bloodstream through your lungs. Both inhaled and noninhaled nicotine may be addictive.  WHAT ARE THE BENEFITS OF QUITTING?  There are many health benefits to quitting smoking. Some are:   The likelihood of developing cancer and heart disease decreases. Health improvements are seen almost immediately.   Blood pressure, pulse rate, and breathing  patterns start returning to normal soon after quitting.   People who quit may see an improvement in their overall quality of life.  HOW DO YOU QUIT SMOKING? Smoking is an addiction with both physical and psychological effects, and longtime habits can be hard to change. Your health care provider can recommend:  Programs and community resources, which may include group support, education, or therapy.  Replacement products, such as patches, gum, and nasal sprays. Use these products only as directed. Do not replace cigarette smoking with electronic cigarettes (commonly called e-cigarettes). The safety of e-cigarettes is unknown, and some may contain harmful chemicals. FOR MORE INFORMATION  American Lung Association: www.lung.org  American Cancer Society: www.cancer.org   This information is not intended to replace advice given to you by your health care provider. Make sure you discuss any questions you have with your health care provider.   Document Released: 10/24/2004 Document Revised: 07/07/2013 Document Reviewed: 03/08/2013 Elsevier Interactive Patient Education 2016 [employer].  Steps to Quit Smoking  Smoking tobacco can be harmful to your health and can affect almost every organ in your body. Smoking puts you, and those around you, at risk for developing many serious chronic diseases. Quitting smoking is difficult, but it is one of the best things that you can do for your health. It is never too late to quit. WHAT ARE THE BENEFITS OF QUITTING SMOKING? When you quit smoking, you lower your risk of developing serious diseases and conditions, such as:  Lung cancer or lung disease, such as COPD.  Heart disease.  Stroke.  Heart attack.  Infertility.  Osteoporosis and bone fractures. Additionally, symptoms such as coughing, wheezing, and shortness of breath may get better when you quit. You may also find that you get sick less often because your body is stronger at fighting off  colds and infections. If you are pregnant, quitting smoking can help to reduce your chances of having a baby of low birth weight. HOW DO I GET READY TO QUIT? When you decide to quit smoking, create a plan to make sure that you are successful. Before you quit:  Pick a date to quit. Set a date within the next two weeks to give you time  to prepare.  Write down the reasons why you are quitting. Keep this list in places where you will see it often, such as on your bathroom mirror or in your car or wallet.  Identify the people, places, things, and activities that make you want to smoke (triggers) and avoid them. Make sure to take these actions:  Throw away all cigarettes at home, at work, and in your car.  Throw away smoking accessories, such as Set designer.  Clean your car and make sure to empty the ashtray.  Clean your home, including curtains and carpets.  Tell your family, friends, and coworkers that you are quitting. Support from your loved ones can make quitting easier.  Talk with your health care provider about your options for quitting smoking.  Find out what treatment options are covered by your health insurance. WHAT STRATEGIES CAN I USE TO QUIT SMOKING?  Talk with your healthcare provider about different strategies to quit smoking. Some strategies include:  Quitting smoking altogether instead of gradually lessening how much you smoke over a period of time. Research shows that quitting "cold [place]" is more successful than gradually quitting.  Attending in-person counseling to help you build problem-solving skills. You are more likely to have success in quitting if you attend several counseling sessions. Even short sessions of 10 minutes can be effective.  Finding resources and support systems that can help you to quit smoking and remain smoke-free after you quit. These resources are most helpful when you use them often. They can include:  Online chats with a  Veterinary surgeon.  Telephone quitlines.  Printed Materials engineer.  Support groups or group counseling.  Text messaging programs.  Mobile phone applications.  Taking medicines to help you quit smoking. (If you are pregnant or breastfeeding, talk with your health care provider first.) Some medicines contain nicotine and some do not. Both types of medicines help with cravings, but the medicines that include nicotine help to relieve withdrawal symptoms. Your health care provider may recommend:  Nicotine patches, gum, or lozenges.  Nicotine inhalers or sprays.  Non-nicotine medicine that is taken by mouth. Talk with your health care provider about combining strategies, such as taking medicines while you are also receiving in-person counseling. Using these two strategies together makes you more likely to succeed in quitting than if you used either strategy on its own. If you are pregnant or breastfeeding, talk with your health care provider about finding counseling or other support strategies to quit smoking. Do not take medicine to help you quit smoking unless told to do so by your health care provider. WHAT THINGS CAN I DO TO MAKE IT EASIER TO QUIT? Quitting smoking might feel overwhelming at first, but there is a lot that you can do to make it easier. Take these important actions:  Reach out to your family and friends and ask that they support and encourage you during this time. Call telephone quitlines, reach out to support groups, or work with a counselor for support.  Ask people who smoke to avoid smoking around you.  Avoid places that trigger you to smoke, such as bars, parties, or smoke-break areas at work.  Spend time around people who do not smoke.  Lessen stress in your life, because stress can be a smoking trigger for some people. To lessen stress, try:  Exercising regularly.  Deep-breathing exercises.  Yoga.  Meditating.  Performing a body scan. This involves closing  your eyes, scanning your body from head to toe, and  noticing which parts of your body are particularly tense. Purposefully relax the muscles in those areas.  Download or purchase mobile phone or tablet apps (applications) that can help you stick to your quit plan by providing reminders, tips, and encouragement. There are many free apps, such as QuitGuide from the [employer] developer(Centers for Disease Control and Prevention). You can find other support for quitting smoking (smoking cessation) through smokefree.gov and other websites. HOW WILL I FEEL WHEN I QUIT SMOKING? Within the first 24 hours of quitting smoking, you may start to feel some withdrawal symptoms. These symptoms are usually most noticeable 2-3 days after quitting, but they usually do not last beyond 2-3 weeks. Changes or symptoms that you might experience include:  Mood swings.  Restlessness, anxiety, or irritation.  Difficulty concentrating.  Dizziness.  Strong cravings for sugary foods in addition to nicotine.  Mild weight gain.  Constipation.  Nausea.  Coughing or a sore throat.  Changes in how your medicines work in your body.  A depressed mood.  Difficulty sleeping (insomnia). After the first 2-3 weeks of quitting, you may start to notice more positive results, such as:  Improved sense of smell and taste.  Decreased coughing and sore throat.  Slower heart rate.  Lower blood pressure.  Clearer skin.  The ability to breathe more easily.  Fewer sick days. Quitting smoking is very challenging for most people. Do not get discouraged if you are not successful the first time. Some people need to make many attempts to quit before they achieve long-term success. Do your best to stick to your quit plan, and talk with your health care provider if you have any questions or concerns.   This information is not intended to replace advice given to you by your health care provider. Make sure you discuss any questions you have with  your health care provider.   Document Released: 09/10/2001 Document Revised: 01/31/2015 Document Reviewed: 01/31/2015 Elsevier Interactive Patient Education 2016 [employer]  Smoking Cessation, Tips for Success If you are ready to quit smoking, congratulations! You have chosen to help yourself be healthier. Cigarettes bring nicotine, tar, carbon monoxide, and other irritants into your body. Your lungs, heart, and blood vessels will be able to work better without these poisons. There are many different ways to quit smoking. Nicotine gum, nicotine patches, a nicotine inhaler, or nicotine nasal spray can help with physical craving. Hypnosis, support groups, and medicines help break the habit of smoking. WHAT THINGS CAN I DO TO MAKE QUITTING EASIER?  Here are some tips to help you quit for good:  Pick a date when you will quit smoking completely. Tell all of your friends and family about your plan to quit on that date.  Do not try to slowly cut down on the number of cigarettes you are smoking. Pick a quit date and quit smoking completely starting on that day.  Throw away all cigarettes.   Clean and remove all ashtrays from your home, work, and car.  On a card, write down your reasons for quitting. Carry the card with you and read it when you get the urge to smoke.  Cleanse your body of nicotine. Drink enough water and fluids to keep your urine clear or pale yellow. Do this after quitting to flush the nicotine from your body.  Learn to predict your moods. Do not let a bad situation be your excuse to have a cigarette. Some situations in your life might tempt you into wanting a cigarette.  Never have "just one" cigarette. It leads to wanting another and another. Remind yourself of your decision to quit.  Change habits associated with smoking. If you smoked while driving or when feeling stressed, try other activities to replace smoking. Stand up when drinking your coffee. Brush your teeth after  eating. Sit in a different chair when you read the paper. Avoid alcohol while trying to quit, and try to drink fewer caffeinated beverages. Alcohol and caffeine may urge you to smoke.  Avoid foods and drinks that can trigger a desire to smoke, such as sugary or spicy foods and alcohol.  Ask people who smoke not to smoke around you.  Have something planned to do right after eating or having a cup of coffee. For example, plan to take a walk or exercise.  Try a relaxation exercise to calm you down and decrease your stress. Remember, you may be tense and nervous for the first 2 weeks after you quit, but this will pass.  Find new activities to keep your hands busy. Play with a pen, coin, or rubber band. Doodle or draw things on paper.  Brush your teeth right after eating. This will help cut down on the craving for the taste of tobacco after meals. You can also try mouthwash.   Use oral substitutes in place of cigarettes. Try using lemon drops, carrots, cinnamon sticks, or chewing gum. Keep them handy so they are available when you have the urge to smoke.  When you have the urge to smoke, try deep breathing.  Designate your home as a nonsmoking area.  If you are a heavy smoker, ask your health care provider about a prescription for nicotine chewing gum. It can ease your withdrawal from nicotine.  Reward yourself. Set aside the cigarette money you save and buy yourself something nice.  Look for support from others. Join a support group or smoking cessation program. Ask someone at home or at work to help you with your plan to quit smoking.  Always ask yourself, "Do I need this cigarette or is this just a reflex?" Tell yourself, "Today, I choose not to smoke," or "I do not want to smoke." You are reminding yourself of your decision to quit.  Do not replace cigarette smoking with electronic cigarettes (commonly called e-cigarettes). The safety of e-cigarettes is unknown, and some may contain  harmful chemicals.  If you relapse, do not give up! Plan ahead and think about what you will do the next time you get the urge to smoke. HOW WILL I FEEL WHEN I QUIT SMOKING? You may have symptoms of withdrawal because your body is used to nicotine (the addictive substance in cigarettes). You may crave cigarettes, be irritable, feel very hungry, cough often, get headaches, or have difficulty concentrating. The withdrawal symptoms are only temporary. They are strongest when you first quit but will go away within 10-14 days. When withdrawal symptoms occur, stay in control. Think about your reasons for quitting. Remind yourself that these are signs that your body is healing and getting used to being without cigarettes. Remember that withdrawal symptoms are easier to treat than the major diseases that smoking can cause.  Even after the withdrawal is over, expect periodic urges to smoke. However, these cravings are generally short lived and will go away whether you smoke or not. Do not smoke! WHAT RESOURCES ARE AVAILABLE TO HELP ME QUIT SMOKING? Your health care provider can direct you to community resources or hospitals for support, which may include:  Group support.  Education.  Hypnosis.  Therapy.   This information is not intended to replace advice given to you by your health care provider. Make sure you discuss any questions you have with your health care provider.   Document Released: 06/14/2004 Document Revised: 10/07/2014 Document Reviewed: 03/04/2013 Elsevier Interactive Patient Education [employer].

## 2016-12-23 NOTE — Progress Notes (Signed)
Subjective:    Patient ID: Larry Hancock, male    DOB: 30-Sep-1956, 61 y.o.   MRN: [id number]  Chief Complaint  Patient presents with  . Establish Care    went to the dentist to get oral surgery and his BP was too high    HPI:  Larry Hancock is a 61 y.o. male who  has a past medical history of Essential hypertension (12/23/2016); Headache; and Pilon fracture. and presents today for an office visit to establish care.   Previously noted to have elevated blood pressures during office visits and never diagnosed with hypertension. Most recently was seen by oral surgery attempting to have a minor procedure and noted to have a systolic blood pressure in the 180's which was repeated a couple of times. Denies worst headache of life or blurred vision. 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 No chest pain or shortness of breath. Nutritional intake is fruits and vegetables; eats a lot of chicken with occasional red meat. Not a whole lot of fried or processed foods. Currently works as a Forensic scientist.   BP Readings from Last 3 Encounters:  12/23/16 (!) 182/114  09/30/15 (!) 187/118  02/13/15 (!) 157/72     No Known Allergies    Outpatient Medications Prior to Visit  Medication Sig Dispense Refill  . diphenhydrAMINE (SOMINEX) 25 MG tablet Take 25 mg by mouth at bedtime as needed for sleep.    Marland Kitchen HYDROcodone-acetaminophen (HYCET) 7.5-325 mg/15 ml solution Take 10 mLs by mouth 4 (four) times daily as needed for moderate pain. 120 mL 0  . ibuprofen (ADVIL,MOTRIN) 200 MG tablet Take 200 mg by mouth every 6 (six) hours as needed for headache.    . oxyCODONE-acetaminophen (ROXICET) 5-325 MG per tablet Take 1 tablet by mouth every 4 (four) hours as needed for severe pain. 60 tablet 0  . Rivaroxaban (XARELTO) 15 MG TABS tablet Take 1 tablet (15 mg total) by mouth 2 (two) times daily with a meal. 40 tablet 0  . rivaroxaban (XARELTO) 20 MG TABS tablet Take 1 tablet  (20 mg total) by mouth daily with supper. 30 tablet 0   No facility-administered medications prior to visit.      Past Medical History:  Diagnosis Date  . Essential hypertension 12/23/2016  . Headache   . Pilon fracture    left      Past Surgical History:  Procedure Laterality Date  . EXTERNAL FIXATION LEG Left 01/31/2015   Procedure: EXTERNAL FIXATION LEG;  Surgeon: Newt Minion, MD;  Location: [facility];  Service: Orthopedics;  Laterality: Left;  . EXTERNAL FIXATION REMOVAL Left 02/08/2015   Procedure: REMOVAL EXTERNAL FIXATION LEG;  Surgeon: Newt Minion, MD;  Location: [place];  Service: Orthopedics;  Laterality: Left;  . EYE SURGERY Left    childhood accident   / blind in left eye  . OPEN REDUCTION INTERNAL FIXATION (ORIF) TIBIA/FIBULA FRACTURE Left 02/08/2015   Procedure: OPEN REDUCTION INTERNAL FIXATION (ORIF) TIBIA/FIBULA ;  Surgeon: Newt Minion, MD;  Location: [facility];  Service: Orthopedics;  Laterality: Left;  . WISDOM TOOTH EXTRACTION        History reviewed. No pertinent family history.    Social History   Social History  . Marital status: Single    Spouse name: N/A  . Number of children: 0  . Years of education: 14   Occupational History  . Loader    Social History Main  Topics  . Smoking status: Light Tobacco Smoker    Packs/day: 0.15    Years: 20.00    Types: Cigarettes  . Smokeless tobacco: Never Used  . Alcohol use No  . Drug use: No  . Sexual activity: Not on file   Other Topics Concern  . Not on file   Social History Narrative   Fun/Hobby: Sleep    Review of Systems  Constitutional: Negative for chills and fever.  Eyes:       Negative for changes in vision  Respiratory: Negative for cough, chest tightness and wheezing.   Cardiovascular: Negative for chest pain, palpitations and leg swelling.  Neurological: Negative for dizziness, weakness and light-headedness.       Objective:    BP (!) 182/114 (BP Location: Left Arm, Patient  Position: Sitting, Cuff Size: Large)   Pulse 86   Temp 97.9 F (36.6 C) (Oral)   Resp 16   Ht '5\' 9"'  (1.753 m)   Wt 212 lb (96.2 kg)   SpO2 98%   BMI 31.31 kg/m  Nursing note and vital signs reviewed.  Physical Exam  Constitutional: He is oriented to person, place, and time. He appears well-developed and well-nourished. No distress.  Eyes:  Blind in left eye.   Cardiovascular: Normal rate, regular rhythm, normal heart sounds and intact distal pulses.   Pulmonary/Chest: Effort normal and breath sounds normal.  Neurological: He is alert and oriented to person, place, and time.  Skin: Skin is warm and dry.  Psychiatric: He has a normal mood and affect. His behavior is normal. Judgment and thought content normal.        Assessment & Plan:   Problem List Items Addressed This Visit      Cardiovascular and Mediastinum   Essential hypertension - Primary    Previously noted to have elevated blood pressure readings and noted during prior oral surgery to have systolic blood pressure readings in the 180s. Symptoms are consistent with hypertension. Denies worst headache of life with no new symptoms of end organ damage noted on physical exam. He is blind in the left eye. Start lisinopril-hydrochlorothiazide. Encouraged to monitor blood pressure at home and follow low-sodium diet. Advised to seek further care if symptoms develop. Continue to monitor. Follow-up in 2 weeks for nurse visit.      Relevant Medications   lisinopril-hydrochlorothiazide (ZESTORETIC) 20-12.5 MG tablet   Other Relevant Orders   Comp Met (CMET)       I have discontinued Mr. Alonzo's ibuprofen, oxyCODONE-acetaminophen, rivaroxaban, Rivaroxaban, diphenhydrAMINE, and HYDROcodone-acetaminophen. I am also having him start on lisinopril-hydrochlorothiazide.   Meds ordered this encounter  Medications  . lisinopril-hydrochlorothiazide (ZESTORETIC) 20-12.5 MG tablet    Sig: Take 1 tablet by mouth daily.    Dispense:  30  tablet    Refill:  1    Order Specific Question:   Supervising Provider    Answer:   Pricilla Holm A [[id number]]     Follow-up: Return if symptoms worsen or fail to improve.  Mauricio Po, FNP

## 2016-12-23 NOTE — Assessment & Plan Note (Signed)
Previously noted to have elevated blood pressure readings and noted during prior oral surgery to have systolic blood pressure readings in the 180s. Symptoms are consistent with hypertension. Denies worst headache of life with no new symptoms of end organ damage noted on physical exam. He is blind in the left eye. Start lisinopril-hydrochlorothiazide. Encouraged to monitor blood pressure at home and follow low-sodium diet. Advised to seek further care if symptoms develop. Continue to monitor. Follow-up in 2 weeks for nurse visit.

## 2017-01-10 ENCOUNTER — Encounter: Payer: Self-pay | Admitting: Family

## 2017-01-10 ENCOUNTER — Ambulatory Visit (INDEPENDENT_AMBULATORY_CARE_PROVIDER_SITE_OTHER): Payer: Managed Care, Other (non HMO) | Admitting: Family

## 2017-01-10 VITALS — BP 150/100 | HR 78 | Resp 16 | Ht 69.0 in | Wt 214.0 lb

## 2017-01-10 DIAGNOSIS — I10 Essential (primary) hypertension: Secondary | ICD-10-CM

## 2017-01-10 MED ORDER — LISINOPRIL-HYDROCHLOROTHIAZIDE 20-12.5 MG PO TABS: 2.0000 | ORAL_TABLET | Freq: Every day | ORAL | 2 refills | Status: AC

## 2017-01-10 NOTE — Assessment & Plan Note (Signed)
Blood pressure remains elevated above goal 150/90 with current medication regimen and no adverse side effects. Increase lisinopril-hydrochlorothiazide to 2 tablets daily. Encouraged follow low-sodium diet and monitor blood pressures at home. Denies worst headache of life with no new symptoms of end organ damage noted on physical exam. Continue to monitor.

## 2017-01-10 NOTE — Patient Instructions (Signed)
Thank you for choosing [facility].  SUMMARY AND INSTRUCTIONS:  Great job getting your blood pressure down. We have a little work to do.  Increase your lisinopril-hydrochlorothiazide to 2 tablets by mouth daily.  Continue to follow a low sodium intake and monitor your blood pressure at home.   Medication:  Your prescription(s) have been submitted to your pharmacy or been printed and provided for you. Please take as directed and contact our office if you believe you are having problem(s) with the medication(s) or have any questions.  Follow up:  If your symptoms worsen or fail to improve, please contact our office for further instruction, or in case of emergency go directly to the emergency room at the closest medical facility.

## 2017-01-10 NOTE — Progress Notes (Signed)
   Subjective:    Patient ID: Larry Hancock, male    DOB: [birth year], 61 y.o.   MRN: [id number]  Chief Complaint  Patient presents with  . Follow-up    high blood pressure    HPI:  Larry Hancock is a 61 y.o. male who  has a past medical history of Essential hypertension (12/23/2016); Headache; and Pilon fracture. and presents today for a follow up office visit.    Hypertension - Currently maintained on lisinopril-hydrochlorothiazide. Reports taking the medication as prescribed and denies adverse side effects or hypotensive readings. Denies worse headache of life with no new symptoms of end organ damage. Blood pressures at home have remained elevated. Working on following a low-sodium diet. Working on increasing physical activity as he just purchased an exercise bike.   BP Readings from Last 3 Encounters:  01/10/17 (!) 150/100  12/23/16 (!) 182/114  09/30/15 (!) 187/118    No Known Allergies    Outpatient Medications Prior to Visit  Medication Sig Dispense Refill  . lisinopril-hydrochlorothiazide (ZESTORETIC) 20-12.5 MG tablet Take 1 tablet by mouth daily. 30 tablet 1   No facility-administered medications prior to visit.      Review of Systems  Constitutional: Negative for chills and fever.  Eyes:       Negative for changes in vision  Respiratory: Negative for cough, chest tightness and wheezing.   Cardiovascular: Negative for chest pain, palpitations and leg swelling.  Neurological: Negative for dizziness, weakness and light-headedness.      Objective:    BP (!) 150/100 (BP Location: Left Arm, Patient Position: Sitting, Cuff Size: Large)   Pulse 78   Resp 16   Ht  (1.753 m)   Wt 214 lb (97.1 kg)   SpO2 97%   BMI 31.60 kg/m  Nursing note and vital signs reviewed.  Physical Exam  Constitutional: He is oriented to person, place, and time. He appears well-developed and well-nourished. No distress.  Cardiovascular: Normal rate, regular rhythm, normal heart sounds  and intact distal pulses.   Pulmonary/Chest: Effort normal and breath sounds normal.  Neurological: He is alert and oriented to person, place, and time.  Skin: Skin is warm and dry.  Psychiatric: He has a normal mood and affect. His behavior is normal. Judgment and thought content normal.       Assessment & Plan:   Problem List Items Addressed This Visit      Cardiovascular and Mediastinum   Essential hypertension - Primary    Blood pressure remains elevated above goal 150/90 with current medication regimen and no adverse side effects. Increase lisinopril-hydrochlorothiazide to 2 tablets daily. Encouraged follow low-sodium diet and monitor blood pressures at home. Denies worst headache of life with no new symptoms of end organ damage noted on physical exam. Continue to monitor.      Relevant Medications   lisinopril-hydrochlorothiazide (ZESTORETIC) 20-12.5 MG tablet       I have changed Mr. Service's lisinopril-hydrochlorothiazide.   Meds ordered this encounter  Medications  . lisinopril-hydrochlorothiazide (ZESTORETIC) 20-12.5 MG tablet    Sig: Take 2 tablets by mouth daily.    Dispense:  60 tablet    Refill:  2    Order Specific Question:   Supervising Provider    Answer:   Hillard Danker A [[id number]]     Follow-up: Return in about 1 month (around 02/09/2017), or if symptoms worsen or fail to improve.  Jeanine Luz, FNP

## 2017-02-11 ENCOUNTER — Telehealth: Payer: Self-pay | Admitting: Family

## 2017-02-11 NOTE — Telephone Encounter (Signed)
Pt came in stating he needs a letter sent to his dentist,  Dr, Kenney Housemanrab stating he is under the care of Tammy SoursGreg. And they need proof that his BP is under control before he has a minor surgery on his mouth.   Fax number [phone]

## 2017-02-11 NOTE — Telephone Encounter (Signed)
LVM letting pt know.  

## 2017-02-11 NOTE — Telephone Encounter (Signed)
Previous blood pressures are improved, but not well controlled. What are his blood pressures at home?

## 2017-02-12 ENCOUNTER — Encounter: Payer: Self-pay | Admitting: Family

## 2017-02-12 ENCOUNTER — Telehealth: Payer: Self-pay | Admitting: Family

## 2017-02-12 NOTE — Telephone Encounter (Signed)
Letter printed. Please fax to below.

## 2017-02-12 NOTE — Telephone Encounter (Signed)
Patient called back to follow up on phone note from 5/15. To inform that his BP are improved. Yesterday his BP stayed around 127/82. This morning it was 115/76. He said he will recheck it again tonight. He states he needs the letter sent to his dentist Dr. Kenney Housemanrab for the minor surgery. Please advise. Thank you.   Fax: [phone]

## 2017-02-12 NOTE — Telephone Encounter (Signed)
Letter has been faxed to dentist. Pt is aware.

## 2019-11-01 DEATH — deceased
